# Patient Record
Sex: Female | Born: 1985 | Race: White | Hispanic: No | Marital: Single | Smoking: Current every day smoker
Health system: Southern US, Community
[De-identification: ages and names within clinical notes are randomized; demographics above are authoritative.]

## PROBLEM LIST (undated history)

## (undated) DIAGNOSIS — J45909 Unspecified asthma, uncomplicated: Secondary | ICD-10-CM

## (undated) DIAGNOSIS — I739 Peripheral vascular disease, unspecified: Secondary | ICD-10-CM

## (undated) DIAGNOSIS — I059 Rheumatic mitral valve disease, unspecified: Secondary | ICD-10-CM

## (undated) HISTORY — PX: ADENOIDECTOMY: SUR15

## (undated) HISTORY — DX: Peripheral vascular disease, unspecified: I73.9

## (undated) HISTORY — DX: Unspecified asthma, uncomplicated: J45.909

## (undated) HISTORY — PX: TONSILLECTOMY: SUR1361

## (undated) HISTORY — PX: VASCULAR SURGERY: SHX849

---

## 2005-03-19 ENCOUNTER — Emergency Department: Payer: Self-pay | Admitting: Emergency Medicine

## 2006-03-25 ENCOUNTER — Emergency Department: Payer: Self-pay | Admitting: General Practice

## 2006-03-28 ENCOUNTER — Emergency Department: Payer: Self-pay | Admitting: Emergency Medicine

## 2006-06-23 ENCOUNTER — Emergency Department: Payer: Self-pay

## 2006-06-25 ENCOUNTER — Emergency Department: Payer: Self-pay | Admitting: Emergency Medicine

## 2006-12-04 ENCOUNTER — Emergency Department: Payer: Self-pay | Admitting: Unknown Physician Specialty

## 2007-10-27 ENCOUNTER — Emergency Department: Payer: Self-pay | Admitting: Emergency Medicine

## 2008-02-14 ENCOUNTER — Emergency Department: Payer: Self-pay | Admitting: Emergency Medicine

## 2010-03-03 ENCOUNTER — Emergency Department: Payer: Self-pay | Admitting: Emergency Medicine

## 2010-04-25 ENCOUNTER — Emergency Department: Payer: Self-pay | Admitting: Emergency Medicine

## 2011-05-27 ENCOUNTER — Ambulatory Visit: Payer: Self-pay

## 2013-04-01 ENCOUNTER — Ambulatory Visit: Payer: Self-pay | Admitting: Physician Assistant

## 2013-05-09 ENCOUNTER — Emergency Department: Payer: Self-pay | Admitting: Emergency Medicine

## 2013-05-09 ENCOUNTER — Ambulatory Visit: Payer: Self-pay

## 2013-08-02 ENCOUNTER — Ambulatory Visit: Payer: Self-pay

## 2015-12-15 ENCOUNTER — Other Ambulatory Visit: Payer: Self-pay | Admitting: Orthopedic Surgery

## 2015-12-15 DIAGNOSIS — M24411 Recurrent dislocation, right shoulder: Secondary | ICD-10-CM

## 2016-01-04 ENCOUNTER — Ambulatory Visit
Admission: RE | Admit: 2016-01-04 | Discharge: 2016-01-04 | Disposition: A | Discharge status: Home / Self Care | Payer: Medicaid Other | Source: Ambulatory Visit | Attending: Orthopedic Surgery | Admitting: Orthopedic Surgery

## 2016-01-04 DIAGNOSIS — G5601 Carpal tunnel syndrome, right upper limb: Secondary | ICD-10-CM | POA: Diagnosis present

## 2016-01-04 DIAGNOSIS — M7581 Other shoulder lesions, right shoulder: Secondary | ICD-10-CM | POA: Insufficient documentation

## 2016-01-04 DIAGNOSIS — M24411 Recurrent dislocation, right shoulder: Secondary | ICD-10-CM | POA: Diagnosis not present

## 2016-08-03 ENCOUNTER — Encounter (INDEPENDENT_AMBULATORY_CARE_PROVIDER_SITE_OTHER): Payer: Self-pay | Admitting: Vascular Surgery

## 2016-08-03 ENCOUNTER — Other Ambulatory Visit (INDEPENDENT_AMBULATORY_CARE_PROVIDER_SITE_OTHER): Payer: Self-pay | Admitting: Vascular Surgery

## 2016-08-03 ENCOUNTER — Ambulatory Visit (INDEPENDENT_AMBULATORY_CARE_PROVIDER_SITE_OTHER): Payer: Medicaid Other | Admitting: Vascular Surgery

## 2016-08-03 ENCOUNTER — Ambulatory Visit (INDEPENDENT_AMBULATORY_CARE_PROVIDER_SITE_OTHER): Payer: Medicaid Other

## 2016-08-03 VITALS — BP 124/75 | HR 74 | Resp 16 | Ht 68.0 in | Wt 230.0 lb

## 2016-08-03 DIAGNOSIS — I83812 Varicose veins of left lower extremities with pain: Secondary | ICD-10-CM

## 2016-08-03 DIAGNOSIS — I872 Venous insufficiency (chronic) (peripheral): Secondary | ICD-10-CM | POA: Insufficient documentation

## 2016-08-03 DIAGNOSIS — I8311 Varicose veins of right lower extremity with inflammation: Secondary | ICD-10-CM

## 2016-08-03 DIAGNOSIS — I8312 Varicose veins of left lower extremity with inflammation: Principal | ICD-10-CM

## 2016-08-03 DIAGNOSIS — M7989 Other specified soft tissue disorders: Secondary | ICD-10-CM

## 2016-08-03 NOTE — Progress Notes (Signed)
Subjective:    Patient ID: Casey Hampton, female    DOB: 1986-02-19, 30 y.o.   MRN: [id number] Chief Complaint  Patient presents with  . Re-evaluation    Ultrasound follow up   Patient presents to review vascular studies. She is s/p a left lower extremity GSV ablation on 11/20/2015 followed by three sclerotherapy treatments. She has continued to wear medical grade one compression stockings and elevate her legs on a daily basis as recommended. Patient endorses a history of "squatting down" and feeling / hearing a "pop" in her left leg. The next day she noticed swelling and was experiencing pain. Compression and elevation has not helped. Her symptoms have worsened to the point they are effecting her ability to function on a daily basis. 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 No DVT or SVT.    Review of Systems  Constitutional: Negative.   HENT: Negative.   Eyes: Negative.   Respiratory: Negative.   Cardiovascular: Positive for leg swelling (Left Lower Extremity).       Left Lower Extremity Pain  Gastrointestinal: Negative.   Endocrine: Negative.   Genitourinary: Negative.   Musculoskeletal: Negative.   Skin: Negative.   Allergic/Immunologic: Negative.   Neurological: Negative.   Hematological: Negative.   Psychiatric/Behavioral: Negative.        Objective:   Physical Exam  Constitutional: She is oriented to person, place, and time. She appears well-developed and well-nourished.  HENT:  Head: Normocephalic and atraumatic.  Eyes: Conjunctivae and EOM are normal. Pupils are equal, round, and reactive to light.  Neck: Normal range of motion.  Cardiovascular: Normal rate, regular rhythm, normal heart sounds and intact distal pulses.   Pulses:      Radial pulses are 2+ on the right side, and 2+ on the left side.       Dorsalis pedis pulses are 2+ on the right side, and 2+ on the left side.      Posterior tibial pulses are 2+ on the right side, and 2+ on the left side.  Pulmonary/Chest: Effort normal and breath sounds normal.  Abdominal: Soft. Bowel sounds are normal.  Musculoskeletal: Normal range of motion. She exhibits edema (Moderate Left Lower Extremity).  Neurological: She is alert and oriented to person, place, and time.  Skin: Skin is warm and dry.  Venous Evaluation- Left- Diffuse varicosities present (largest area on anterior and lateral calf area.) and Varicosities measure 6-12mm.  Psychiatric: She has a normal mood and affect. Her behavior is normal. Judgment and thought content normal.   BP 124/75 (BP Location: Right Arm)   Pulse 74   Resp 16   Ht 5\' 8"  (1.727 m)   Wt 230 lb (104.3 kg)   BMI 34.97 kg/m   Past Medical History:  Diagnosis Date  . Asthma   . Peripheral vascular disease [facility])     Social History   Social History  . Marital status: Single    Spouse name: N/A  . Number of children: N/A  . Years of education: N/A   Occupational History  . Not on file.   Social History Main Topics  . Smoking status: Current Every Day Smoker  . Smokeless tobacco: Never Used  . Alcohol use Yes  . Drug use: No  . Sexual activity: Not on file   Other Topics Concern  . Not on file   Social History Narrative  . No narrative on file  Past Surgical History:  Procedure Laterality Date  . TONSILLECTOMY      Family History  Problem Relation Age of Onset  . Varicose Veins Mother   . Hyperlipidemia Mother   . Hypertension Mother     Allergies  Allergen Reactions  . Sulfa Antibiotics Anaphylaxis      Assessment & Plan:  Patient presents to review vascular studies. She is s/p a left lower extremity GSV ablation on 11/20/2015 followed by three sclerotherapy treatments. She has continued to wear medical grade one compression stockings and elevate her legs on a daily basis as recommended. Patient endorses a history of "squatting down" and feeling /  hearing a "pop" in her left leg. The next day she noticed swelling and was experiencing pain. Compression and elevation has not helped. Her symptoms have worsened to the point they are effecting her ability to function on a daily basis. 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 No DVT or SVT.   1. Chronic venous insufficiency - Worsening Patient with recannulated LLE GSV. Now symptomatic to the point it is interfering with her ability to function on a daily basis. Recommend laser ablation at a higher frequency to re-ablate her GSV. Would benefit from foam sclerotherapy into accessory vein with reflux/.   2. Swelling of left lower extremity - Worsening Applying for laser ablation of GSV. Continue compression and elevation.   3. Varicose veins of left lower extremity with inflammation - Worsening Applying for laser ablation of GSV. Continue compression and elevation.   No current outpatient prescriptions on file prior to visit.   No current facility-administered medications on file prior to visit.     There are no Patient Instructions on file for this visit. No Follow-up on file.   Travonte Byard A Marieclaire Bettenhausen, PA-C

## 2016-08-31 ENCOUNTER — Other Ambulatory Visit: Payer: Self-pay | Admitting: Nurse Practitioner

## 2016-08-31 DIAGNOSIS — R1031 Right lower quadrant pain: Secondary | ICD-10-CM

## 2016-09-06 ENCOUNTER — Ambulatory Visit
Admission: RE | Admit: 2016-09-06 | Discharge: 2016-09-06 | Disposition: A | Discharge status: Home / Self Care | Payer: Medicaid Other | Source: Ambulatory Visit | Attending: Nurse Practitioner | Admitting: Nurse Practitioner

## 2016-09-06 DIAGNOSIS — R1031 Right lower quadrant pain: Secondary | ICD-10-CM | POA: Diagnosis not present

## 2017-08-01 ENCOUNTER — Encounter: Payer: Self-pay | Admitting: Emergency Medicine

## 2017-08-01 DIAGNOSIS — J45909 Unspecified asthma, uncomplicated: Secondary | ICD-10-CM | POA: Insufficient documentation

## 2017-08-01 DIAGNOSIS — Z203 Contact with and (suspected) exposure to rabies: Secondary | ICD-10-CM | POA: Diagnosis present

## 2017-08-01 DIAGNOSIS — Z23 Encounter for immunization: Secondary | ICD-10-CM | POA: Diagnosis not present

## 2017-08-01 DIAGNOSIS — Z2914 Encounter for prophylactic rabies immune globin: Secondary | ICD-10-CM | POA: Insufficient documentation

## 2017-08-01 DIAGNOSIS — F17200 Nicotine dependence, unspecified, uncomplicated: Secondary | ICD-10-CM | POA: Diagnosis not present

## 2017-08-01 NOTE — ED Triage Notes (Signed)
Patient ambulatory to triage with steady gait, without difficulty or distress noted; pt reports was handling a bat Friday and Saturday; st was called by the health department and notified bat was tested positive for rabies

## 2017-08-02 ENCOUNTER — Emergency Department
Admission: EM | Admit: 2017-08-02 | Discharge: 2017-08-02 | Disposition: A | Discharge status: Home / Self Care | Payer: Medicaid Other | Attending: Emergency Medicine | Admitting: Emergency Medicine

## 2017-08-02 DIAGNOSIS — Z203 Contact with and (suspected) exposure to rabies: Secondary | ICD-10-CM

## 2017-08-02 MED ORDER — RABIES IMMUNE GLOBULIN 150 UNIT/ML IM INJ
20.0000 [IU]/kg | INJECTION | Freq: Once | INTRAMUSCULAR | Status: AC
  Administered 2017-08-02: 2250 [IU] via INTRAMUSCULAR
  Filled 2017-08-02: qty 15

## 2017-08-02 MED ORDER — RABIES VACCINE, PCEC IM SUSR
1.0000 mL | Freq: Once | INTRAMUSCULAR | Status: AC
  Administered 2017-08-02: 1 mL via INTRAMUSCULAR
  Filled 2017-08-02: qty 1

## 2017-08-02 NOTE — ED Notes (Signed)
Pt states dog got bat, which pt then handled with gloves, but bat was biting gloves. Animal Control informed pt of positive rabies in bat and to get vaccinated. Pt denies any sx abnormal, reports WNL

## 2017-08-02 NOTE — ED Notes (Signed)
No answer when called from lobby 

## 2017-08-02 NOTE — ED Notes (Signed)
Awaiting meds from pharmacy  

## 2017-08-02 NOTE — Discharge Instructions (Signed)
Please follow up with your primary care physician for further evaluation of your symptoms.  °

## 2017-08-02 NOTE — ED Provider Notes (Signed)
[facility] Emergency Department Provider Note   ____________________________________________   First MD Initiated Contact with Patient 08/02/17 [phone]     (approximate)  I have reviewed the triage vital signs and the nursing notes.   HISTORY  Chief Complaint Rabies Injection    HPI Casey Hampton is a 31 y.o. female Who comes into the hospital today with rabies exposure. The patient states that on Friday her dog found a live bat. The patient reports that she handled it for approximately 2 days. She reports that she was giving waterand holding it. She wore gloves but reports that the bat had been biting gloves and the towel. The patient reports that she had it for 2 days before sending it to animal control. The bat was tested in today it was  confirmed that the bat was positive for rabies. She is unsure if it actually bit into her skin but she was exposed to saliva. The patient was told to come in by the health department for rabies vaccine. She is here today for that treatment. He reports that her hands where she could've been bitten are raw and she does pick at them often. She doesn't have any specific bite marks. She is in no pain.   Past Medical History:  Diagnosis Date  . Asthma   . Peripheral vascular disease [facility](HCC)     Patient Active Problem List   Diagnosis Date Noted  . Swelling of left lower extremity 08/03/2016  . Varicose veins of left lower extremity with inflammation 08/03/2016  . Chronic venous insufficiency 08/03/2016    Past Surgical History:  Procedure Laterality Date  . ADENOIDECTOMY    . TONSILLECTOMY      Prior to Admission medications   Not on File    Allergies Sulfa antibiotics  Family History  Problem Relation Age of Onset  . Varicose Veins Mother   . Hyperlipidemia Mother   . Hypertension Mother     Social History Social History  Substance Use Topics  . Smoking status: Current Every Day Smoker  . Smokeless  tobacco: Never Used  . Alcohol use Yes    Review of Systems  Constitutional: No fever/chills Eyes: No visual changes. ENT: No sore throat. Cardiovascular: Denies chest pain. Respiratory: Denies shortness of breath. Gastrointestinal: No abdominal pain.  No nausea, no vomiting.  No diarrhea.  No constipation. Genitourinary: Negative for dysuria. Musculoskeletal: Negative for back pain. Skin: Negative for rash. Neurological: Negative for headaches, focal weakness or numbness.   ____________________________________________   PHYSICAL EXAM:  VITAL SIGNS: ED Triage Vitals  Enc Vitals Group     BP 08/01/17 2342 128/79     Pulse Rate 08/01/17 2342 98     Resp 08/01/17 2342 18     Temp 08/01/17 2342 98 F (36.7 C)     Temp Source 08/01/17 2342 Oral     SpO2 08/01/17 2342 99 %     Weight 08/01/17 2340 244 lb (110.7 kg)     Height 08/01/17 2340 5\' 9"  (1.753 m)     Head Circumference --      Peak Flow --      Pain Score --      Pain Loc --      Pain Edu? --      Excl. in GC? --     Constitutional: Alert and oriented. Well appearing and in no acute distress. Eyes: Conjunctivae are normal. PERRL. EOMI. Head: Atraumatic. Nose: No congestion/rhinnorhea. Mouth/Throat: Mucous membranes are  moist.  Oropharynx non-erythematous. Cardiovascular: Normal rate, regular rhythm. Grossly normal heart sounds.  Good peripheral circulation. Respiratory: Normal respiratory effort.  No retractions. Lungs CTAB. Gastrointestinal: Soft and nontender. No distention. positive bowel sounds Musculoskeletal: No lower extremity tenderness nor edema.   Neurologic:  Normal speech and language.  Skin:  Skin is warm, dry and intact.  Psychiatric: Mood and affect are normal.   ____________________________________________   LABS (all labs ordered are listed, but only abnormal results are displayed)  Labs Reviewed  POC URINE PREG, ED    ____________________________________________  EKG  none ____________________________________________  RADIOLOGY  No results found.  ____________________________________________   PROCEDURES  Procedure(s) performed: None  Procedures  Critical Care performed: No  ____________________________________________   INITIAL IMPRESSION / ASSESSMENT AND PLAN / ED COURSE  As part of my medical decision making, I reviewed the following data within the electronic MEDICAL RECORD NUMBER Notes from prior ED visits and [place] Controlled Substance Database   This is a 31 year old female who comes into the hospital today after having rabies exposure. The patient was sent by the health department to receive a rabies vaccine.  Since the patient was exposed to a bat was positive for rabies I will give the patient the rabies vaccine as well as the immunoglobulin. The patient will be discharged to follow-up with her primary care physician. She has no further complaints or concerns.      ____________________________________________   FINAL CLINICAL IMPRESSION(S) / ED DIAGNOSES  Final diagnoses:  Rabies exposure      NEW MEDICATIONS STARTED DURING THIS VISIT:  New Prescriptions   No medications on file     Note:  This document was prepared using Dragon voice recognition software and may include unintentional dictation errors.    Rebecka Apley, MD 08/02/17 [phone]

## 2017-08-02 NOTE — ED Notes (Signed)

## 2017-08-06 ENCOUNTER — Encounter: Payer: Self-pay | Admitting: *Deleted

## 2017-08-06 ENCOUNTER — Ambulatory Visit
Admission: EM | Admit: 2017-08-06 | Discharge: 2017-08-06 | Disposition: A | Discharge status: Home / Self Care | Payer: Medicaid Other

## 2017-08-06 DIAGNOSIS — Z203 Contact with and (suspected) exposure to rabies: Secondary | ICD-10-CM

## 2017-08-06 DIAGNOSIS — Z23 Encounter for immunization: Secondary | ICD-10-CM

## 2017-08-06 MED ORDER — RABIES VACCINE, PCEC IM SUSR
1.0000 mL | Freq: Once | INTRAMUSCULAR | Status: AC
  Administered 2017-08-06: 1 mL via INTRAMUSCULAR

## 2017-08-06 NOTE — ED Triage Notes (Signed)
Patient arrived for rabies vaccination.

## 2017-08-13 ENCOUNTER — Ambulatory Visit
Admission: EM | Admit: 2017-08-13 | Discharge: 2017-08-13 | Disposition: A | Discharge status: Home / Self Care | Payer: Medicaid Other | Attending: Family Medicine | Admitting: Family Medicine

## 2017-08-13 DIAGNOSIS — Z203 Contact with and (suspected) exposure to rabies: Secondary | ICD-10-CM

## 2017-08-13 DIAGNOSIS — Z23 Encounter for immunization: Secondary | ICD-10-CM | POA: Diagnosis not present

## 2017-08-13 MED ORDER — RABIES VACCINE, PCEC IM SUSR
1.0000 mL | Freq: Once | INTRAMUSCULAR | Status: AC
  Administered 2017-08-13: 1 mL via INTRAMUSCULAR

## 2017-08-13 NOTE — ED Triage Notes (Signed)
Patient is here for rabies vaccine DAY 7. Patient will return in 1 week for Day 14 and last dose.

## 2017-08-13 NOTE — Discharge Instructions (Signed)
Return in 7 days for DAY 14 of Rabies Vaccine.

## 2017-08-21 ENCOUNTER — Ambulatory Visit
Admission: EM | Admit: 2017-08-21 | Discharge: 2017-08-21 | Disposition: A | Discharge status: Home / Self Care | Payer: Medicaid Other | Attending: Family Medicine | Admitting: Family Medicine

## 2017-08-21 DIAGNOSIS — Z203 Contact with and (suspected) exposure to rabies: Secondary | ICD-10-CM

## 2017-08-21 DIAGNOSIS — Z23 Encounter for immunization: Secondary | ICD-10-CM | POA: Diagnosis not present

## 2017-08-21 MED ORDER — RABIES VACCINE, PCEC IM SUSR
1.0000 mL | Freq: Once | INTRAMUSCULAR | Status: AC
  Administered 2017-08-21: 1 mL via INTRAMUSCULAR

## 2017-08-21 NOTE — ED Triage Notes (Signed)
Patient is here for Day 14 vaccine. Patient has had no adverse signs and symptoms to previous injections.

## 2017-08-21 NOTE — ED Notes (Signed)
Patient waited 15 minutes for observation. No adverse signs or symptoms noted at discharge.

## 2017-09-18 ENCOUNTER — Ambulatory Visit
Admission: EM | Admit: 2017-09-18 | Discharge: 2017-09-18 | Disposition: A | Discharge status: Home / Self Care | Payer: Medicaid Other | Attending: Family Medicine | Admitting: Family Medicine

## 2017-09-18 ENCOUNTER — Encounter: Payer: Self-pay | Admitting: Emergency Medicine

## 2017-09-18 ENCOUNTER — Other Ambulatory Visit: Payer: Self-pay

## 2017-09-18 DIAGNOSIS — F17200 Nicotine dependence, unspecified, uncomplicated: Secondary | ICD-10-CM | POA: Diagnosis not present

## 2017-09-18 DIAGNOSIS — J45909 Unspecified asthma, uncomplicated: Secondary | ICD-10-CM | POA: Diagnosis not present

## 2017-09-18 DIAGNOSIS — J020 Streptococcal pharyngitis: Secondary | ICD-10-CM | POA: Diagnosis not present

## 2017-09-18 DIAGNOSIS — I872 Venous insufficiency (chronic) (peripheral): Secondary | ICD-10-CM | POA: Diagnosis not present

## 2017-09-18 DIAGNOSIS — R05 Cough: Secondary | ICD-10-CM | POA: Insufficient documentation

## 2017-09-18 DIAGNOSIS — I739 Peripheral vascular disease, unspecified: Secondary | ICD-10-CM | POA: Insufficient documentation

## 2017-09-18 DIAGNOSIS — Z79899 Other long term (current) drug therapy: Secondary | ICD-10-CM | POA: Diagnosis not present

## 2017-09-18 DIAGNOSIS — J029 Acute pharyngitis, unspecified: Secondary | ICD-10-CM | POA: Diagnosis present

## 2017-09-18 HISTORY — DX: Rheumatic mitral valve disease, unspecified: I05.9

## 2017-09-18 LAB — RAPID STREP SCREEN (MED CTR MEBANE ONLY): STREPTOCOCCUS, GROUP A SCREEN (DIRECT): POSITIVE — AB

## 2017-09-18 MED ORDER — FLUCONAZOLE 150 MG PO TABS: 150.0000 mg | ORAL_TABLET | Freq: Once | ORAL | 0 refills | Status: AC

## 2017-09-18 MED ORDER — AZITHROMYCIN 250 MG PO TABS
250.0000 mg | ORAL_TABLET | Freq: Every day | ORAL | 0 refills | Status: DC
Start: 2017-09-18 — End: 2017-12-04

## 2017-09-18 NOTE — ED Triage Notes (Signed)
Patient in today c/o sore throat x 1 day. Patient started today with cough, sneezing and nasal congestion. Patient has not tried anything OTC. Patient has not checked her temperature.

## 2017-09-18 NOTE — Discharge Instructions (Signed)
Recommend start Zithromax daily as directed. May continue Amoxicillin as directed from your dentist. May take Tylenol 1000mg  every 8 hours as needed for pain. If yeast infection develops from antibiotic use, may use Diflucan 150mg  one time, may repeat in 5 days if needed. Recommend follow-up with your PCP in 3 days if not improving.

## 2017-09-18 NOTE — ED Provider Notes (Signed)
[facility]    CSN: [id number] Arrival date & time: 09/18/17  1811     History   Chief Complaint Chief Complaint  Patient presents with  . Sore Throat    HPI Casey Hampton is a 31 y.o. female.   31 year old female presents with sore throat that started yesterday. Today started experiencing mild nasal congestion and cough. Feels warm but no distinct fever. Denies any GI symptoms. No other family members ill. Did receive the flu shot. Has history of peripheral swelling and pain as well as Asthma and is on HCTZ daily and Ibuprofen and Albuterol as needed. Also having multiple dental issues- needs multiple repairs- dentist has prescribed Amoxicillin in the past- started having more dental pain 5 days ago and started taking her left over Amoxicillin 875mg  just once a day for the past 4 days. Has not taken any other OTC medications for symptoms.    The history is provided by the patient.    Past Medical History:  Diagnosis Date  . Asthma   . Mitral valve disorder   . Peripheral vascular disease [facility](HCC)     Patient Active Problem List   Diagnosis Date Noted  . Swelling of left lower extremity 08/03/2016  . Varicose veins of left lower extremity with inflammation 08/03/2016  . Chronic venous insufficiency 08/03/2016    Past Surgical History:  Procedure Laterality Date  . ADENOIDECTOMY    . TONSILLECTOMY    . VASCULAR SURGERY      OB History    No data available       Home Medications    Prior to Admission medications   Medication Sig Start Date End Date Taking? Authorizing Provider  albuterol (PROVENTIL HFA;VENTOLIN HFA) 108 (90 Base) MCG/ACT inhaler Inhale 1 puff into the lungs every 6 (six) hours as needed for wheezing or shortness of breath.   Yes [provider]  hydrochlorothiazide (HYDRODIURIL) 25 MG tablet Take 25 mg by mouth daily.   Yes [provider]  ibuprofen (ADVIL,MOTRIN) 800 MG tablet Take 800 mg by mouth 2 (two) times  daily.   Yes [provider]  loratadine (CLARITIN) 10 MG tablet Take 10 mg by mouth daily.   Yes [provider]  phentermine 37.5 MG capsule Take 37.5 mg by mouth every morning.   Yes [provider]  traMADol (ULTRAM) 50 MG tablet Take by mouth every 6 (six) hours as needed.   Yes [provider]  azithromycin (ZITHROMAX) 250 MG tablet Take 1 tablet (250 mg total) by mouth daily. Take first 2 tablets together, then 1 every day until finished. 09/18/17   Sudie GrumblingAmyot, Dreyah Montrose Berry, NP  fluconazole (DIFLUCAN) 150 MG tablet Take 1 tablet (150 mg total) by mouth once for 1 dose. May repeat 1 tablet in 5 days if needed. 09/18/17 09/18/17  Sudie GrumblingAmyot, Broly Hatfield Berry, NP  olopatadine (PATANOL) 0.1 % ophthalmic solution Place 1 drop into both eyes 2 (two) times daily.    [provider]    Family History Family History  Problem Relation Age of Onset  . Varicose Veins Mother   . Hyperlipidemia Mother   . Hypertension Mother     Social History Social History   Tobacco Use  . Smoking status: Current Every Day Smoker  . Smokeless tobacco: Never Used  Substance Use Topics  . Alcohol use: Yes    Comment: rarely  . Drug use: No     Allergies   Sulfa antibiotics   Review  of Systems Review of Systems  Constitutional: Positive for chills and fatigue. Negative for activity change, appetite change and fever.  HENT: Positive for congestion, postnasal drip, sore throat and trouble swallowing. Negative for ear discharge, ear pain, facial swelling, mouth sores, rhinorrhea, sinus pressure and sinus pain.   Eyes: Negative for pain, discharge, redness and itching.  Respiratory: Positive for cough. Negative for chest tightness, shortness of breath and wheezing.   Gastrointestinal: Negative for abdominal pain, diarrhea, nausea and vomiting.  Musculoskeletal: Positive for arthralgias. Negative for neck pain and neck stiffness.  Skin: Negative for rash and wound.  Neurological:  Negative for dizziness, seizures, syncope, weakness, light-headedness, numbness and headaches.  Hematological: Negative for adenopathy. Does not bruise/bleed easily.     Physical Exam Triage Vital Signs ED Triage Vitals  Enc Vitals Group     BP 09/18/17 1821 115/75     Pulse Rate 09/18/17 1821 (!) 107     Resp 09/18/17 1821 16     Temp 09/18/17 1821 98.2 F (36.8 C)     Temp Source 09/18/17 1821 Oral     SpO2 09/18/17 1821 100 %     Weight 09/18/17 1821 245 lb (111.1 kg)     Height 09/18/17 1821 5\' 8"  (1.727 m)     Head Circumference --      Peak Flow --      Pain Score 09/18/17 1822 7     Pain Loc --      Pain Edu? --      Excl. in GC? --    No data found.  Updated Vital Signs BP 115/75 (BP Location: Left Arm)   Pulse (!) 107   Temp 98.2 F (36.8 C) (Oral)   Resp 16   Ht 5\' 8"  (1.727 m)   Wt 245 lb (111.1 kg)   LMP 08/26/2017   SpO2 100%   BMI 37.25 kg/m   Visual Acuity Right Eye Distance:   Left Eye Distance:   Bilateral Distance:    Right Eye Near:   Left Eye Near:    Bilateral Near:     Physical Exam  Constitutional: She is oriented to person, place, and time. She appears well-developed and well-nourished. She appears ill. No distress.  HENT:  Head: Normocephalic and atraumatic.  Right Ear: Hearing, tympanic membrane, external ear and ear canal normal.  Left Ear: Hearing, tympanic membrane, external ear and ear canal normal.  Nose: Rhinorrhea present. Right sinus exhibits no maxillary sinus tenderness and no frontal sinus tenderness. Left sinus exhibits no maxillary sinus tenderness and no frontal sinus tenderness.  Mouth/Throat: Uvula is midline and mucous membranes are normal. Oropharyngeal exudate, posterior oropharyngeal edema and posterior oropharyngeal erythema present.  Tonsils absent  Eyes: Conjunctivae and EOM are normal.  Neck: Normal range of motion. Neck supple.  Cardiovascular: Regular rhythm. Tachycardia present.  No murmur  heard. Pulmonary/Chest: Effort normal and breath sounds normal. No stridor. No respiratory distress. She has no decreased breath sounds. She has no wheezes. She has no rhonchi.  Lymphadenopathy:    She has cervical adenopathy.       Right cervical: Superficial cervical adenopathy present.       Left cervical: Superficial cervical adenopathy present.  Neurological: She is alert and oriented to person, place, and time.  Skin: Skin is warm and dry. No rash noted.  Psychiatric: She has a normal mood and affect. Her behavior is normal. Judgment and thought content normal.     UC Treatments / Results  Labs (all labs ordered are listed, but only abnormal results are displayed) Labs Reviewed  RAPID STREP SCREEN (NOT AT [facility]) - Abnormal; Notable for the following components:      Result Value   Streptococcus, Group A Screen (Direct) POSITIVE (*)    All other components within normal limits    EKG  EKG Interpretation None       Radiology No results found.  Procedures Procedures (including critical care time)  Medications Ordered in UC Medications - No data to display   Initial Impression / Assessment and Plan / UC Course  I have reviewed the triage vital signs and the nursing notes.  Pertinent labs & imaging results that were available during my care of the patient were reviewed by me and considered in my medical decision making (see chart for details).    Reviewed positive rapid strep test with patient. Since she is currently on Amoxicillin and still having symptoms, recommend start Zithromax as directed. Continue Amoxicillin for dental issues as directed. May take Diflucan 150mg  one time if yeast vaginitis develops from antibiotic use- may repeat in 5 days if needed. Recommend Tylenol 1000mg  every 8 hours as needed for pain. Follow-up with her PCP in 3 days if not improving.   Final Clinical Impressions(s) / UC Diagnoses   Final diagnoses:  Strep pharyngitis    ED  Discharge Orders        Ordered    azithromycin (ZITHROMAX) 250 MG tablet  Daily     09/18/17 1854    fluconazole (DIFLUCAN) 150 MG tablet   Once     09/18/17 1854       Controlled Substance Prescriptions [place] Controlled Substance Registry consulted? Not Applicable   Sudie Grumbling, NP 09/18/17 2328

## 2017-12-04 ENCOUNTER — Ambulatory Visit
Admission: EM | Admit: 2017-12-04 | Discharge: 2017-12-04 | Disposition: A | Discharge status: Home / Self Care | Payer: Medicaid Other | Attending: Family Medicine | Admitting: Family Medicine

## 2017-12-04 DIAGNOSIS — K047 Periapical abscess without sinus: Secondary | ICD-10-CM

## 2017-12-04 MED ORDER — AMOXICILLIN-POT CLAVULANATE 875-125 MG PO TABS: 1.0000 | ORAL_TABLET | Freq: Two times a day (BID) | ORAL | 0 refills | Status: DC

## 2017-12-04 MED ORDER — TRAMADOL HCL 50 MG PO TABS: 50.0000 mg | ORAL_TABLET | Freq: Three times a day (TID) | ORAL | 0 refills | Status: DC | PRN

## 2017-12-04 NOTE — Discharge Instructions (Signed)
Antibiotic as prescribed.  Please see your dentist.  Take care  Dr. Tailey Top  

## 2017-12-04 NOTE — ED Triage Notes (Signed)
Pt said she needs a root canal on her left side of her mouth. Now having facial swelling up under her eye, along with some nausea and diarrhea. Still having pain, headache, sensitivity to light and cold chills as well. This has been going on since Friday. Did not take any otc medications.

## 2017-12-04 NOTE — ED Provider Notes (Signed)
[facility]  CSN: [id number] Arrival date & time: 12/04/17  1926  History   Chief Complaint Chief Complaint  Patient presents with  . Oral Swelling   HPI  32 year old female presents with a dental infection.  States that this is been going on for quite some time.  She has seen a dentist and was told that she needed an extensive amount of work done.  She states that since Friday it has been worsening.  Pain is worse and she reports associated left-sided facial swelling.  No fever.  No chills.  No improvement with over-the-counter ibuprofen.  Her pain is located in the left upper dentition.  No other associated symptoms.  No other complaints at this time.  Past Medical History:  Diagnosis Date  . Asthma   . Mitral valve disorder   . Peripheral vascular disease [facility](HCC)    Patient Active Problem List   Diagnosis Date Noted  . Swelling of left lower extremity 08/03/2016  . Varicose veins of left lower extremity with inflammation 08/03/2016  . Chronic venous insufficiency 08/03/2016   Past Surgical History:  Procedure Laterality Date  . ADENOIDECTOMY    . TONSILLECTOMY    . VASCULAR SURGERY     OB History    No data available     Home Medications    Prior to Admission medications   Medication Sig Start Date End Date Taking? Authorizing Provider  hydrochlorothiazide (HYDRODIURIL) 25 MG tablet Take 25 mg by mouth daily.   Yes [provider]  phentermine 37.5 MG capsule Take 37.5 mg by mouth every morning.   Yes [provider]  albuterol (PROVENTIL HFA;VENTOLIN HFA) 108 (90 Base) MCG/ACT inhaler Inhale 1 puff into the lungs every 6 (six) hours as needed for wheezing or shortness of breath.    [provider]  amoxicillin-clavulanate (AUGMENTIN) 875-125 MG tablet Take 1 tablet by mouth every 12 (twelve) hours. 12/04/17   Tommie Samsook, Ernestina Joe G, DO  loratadine (CLARITIN) 10 MG tablet Take 10 mg by mouth daily.    [provider]  traMADol  (ULTRAM) 50 MG tablet Take 1 tablet (50 mg total) by mouth every 8 (eight) hours as needed. 12/04/17   Tommie Samsook, Jarrid Lienhard G, DO   Family History Family History  Problem Relation Age of Onset  . Varicose Veins Mother   . Hyperlipidemia Mother   . Hypertension Mother    Social History Social History   Tobacco Use  . Smoking status: Current Every Day Smoker  . Smokeless tobacco: Never Used  Substance Use Topics  . Alcohol use: Yes    Comment: rarely  . Drug use: No    Allergies   Sulfa antibiotics  Review of Systems Review of Systems  Constitutional: Negative for fever.  HENT: Positive for dental problem.        Facial swelling.   Physical Exam Triage Vital Signs ED Triage Vitals  Enc Vitals Group     BP 12/04/17 1936 119/74     Pulse Rate 12/04/17 1936 94     Resp 12/04/17 1936 18     Temp 12/04/17 1936 98.2 F (36.8 C)     Temp Source 12/04/17 1936 Oral     SpO2 12/04/17 1936 100 %     Weight 12/04/17 1938 230 lb (104.3 kg)     Height --      Head Circumference --      Peak Flow --      Pain Score 12/04/17 1938 8  Pain Loc --      Pain Edu? --      Excl. in GC? --    Updated Vital Signs BP 119/74 (BP Location: Left Arm)   Pulse 94   Temp 98.2 F (36.8 C) (Oral)   Resp 18   Wt 230 lb (104.3 kg)   LMP 11/27/2017 (Exact Date)   SpO2 100%   BMI 34.97 kg/m    Physical Exam  Constitutional: She is oriented to person, place, and time. She appears well-developed. No distress.  HENT:  Head: Normocephalic and atraumatic.  Oropharynx clear. Poor dentition. Caries noted.   Cardiovascular: Normal rate and regular rhythm.  No murmur heard. Pulmonary/Chest: Effort normal and breath sounds normal. She has no wheezes. She has no rales.  Neurological: She is alert and oriented to person, place, and time.  Psychiatric: She has a normal mood and affect. Her behavior is normal.  Nursing note and vitals reviewed.  UC Treatments / Results  Labs (all labs ordered are  listed, but only abnormal results are displayed) Labs Reviewed - No data to display  EKG  EKG Interpretation None       Radiology No results found.  Procedures Procedures (including critical care time)  Medications Ordered in UC Medications - No data to display   Initial Impression / Assessment and Plan / UC Course  I have reviewed the triage vital signs and the nursing notes.  Pertinent labs & imaging results that were available during my care of the patient were reviewed by me and considered in my medical decision making (see chart for details).     32 year old female presents with a dental infection. Treating with Augmentin and Tramadol.   Final Clinical Impressions(s) / UC Diagnoses   Final diagnoses:  Dental infection    ED Discharge Orders        Ordered    amoxicillin-clavulanate (AUGMENTIN) 875-125 MG tablet  Every 12 hours     12/04/17 1955    traMADol (ULTRAM) 50 MG tablet  Every 8 hours PRN     12/04/17 1955     Controlled Substance Prescriptions [place] Controlled Substance Registry consulted? Yes, I have consulted the  Controlled Substances Registry for this patient, and feel the risk/benefit ratio today is favorable for proceeding with this prescription for a controlled substance.   Tommie Sams, [place] 12/04/17 2008

## 2018-03-02 IMAGING — US US TRANSVAGINAL NON-OB
1 series · 14 of 25 positions shown · non-contrast
Comparison: None

CLINICAL DATA: Abdominal pain.

EXAM:
TRANSABDOMINAL AND TRANSVAGINAL ULTRASOUND OF PELVIS
TECHNIQUE: Both transabdominal and transvaginal ultrasound examinations of the
pelvis were performed. Transabdominal technique was performed for
global imaging of the pelvis including uterus, ovaries, adnexal
regions, and pelvic cul-de-sac. It was necessary to proceed with
endovaginal exam following the transabdominal exam to visualize the
uterus and ovaries.

[Series 1: us transvaginal non-ob · 0.24mm/px · 14 of 105 slices shown]
[im 1/105]
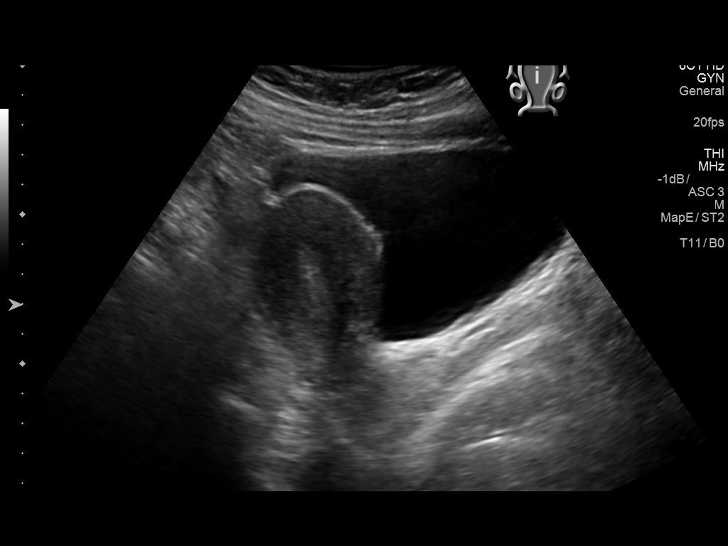
[im 9/105]
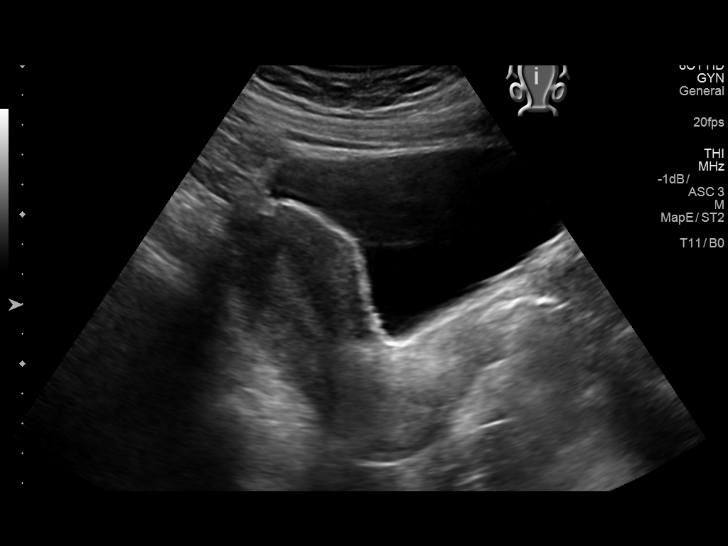
[im 18/105]
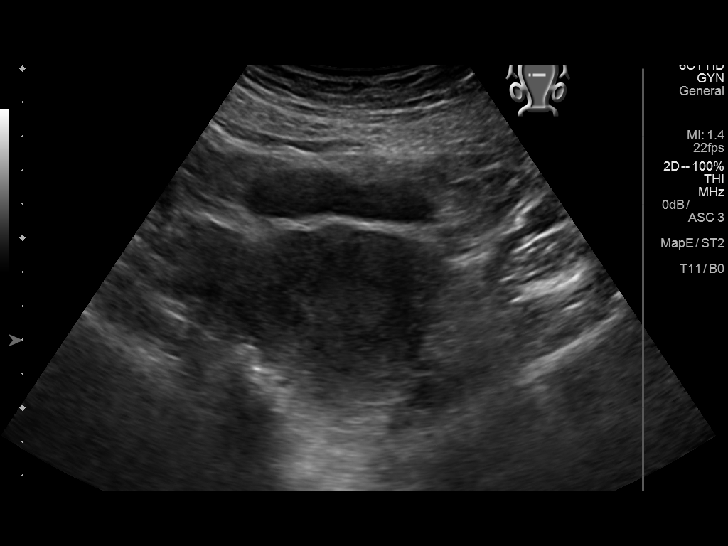
[im 27/105]
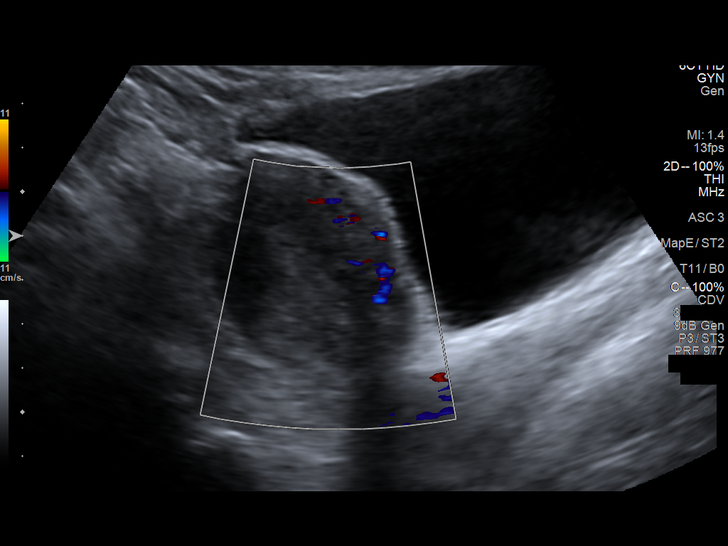
[im 35/105]
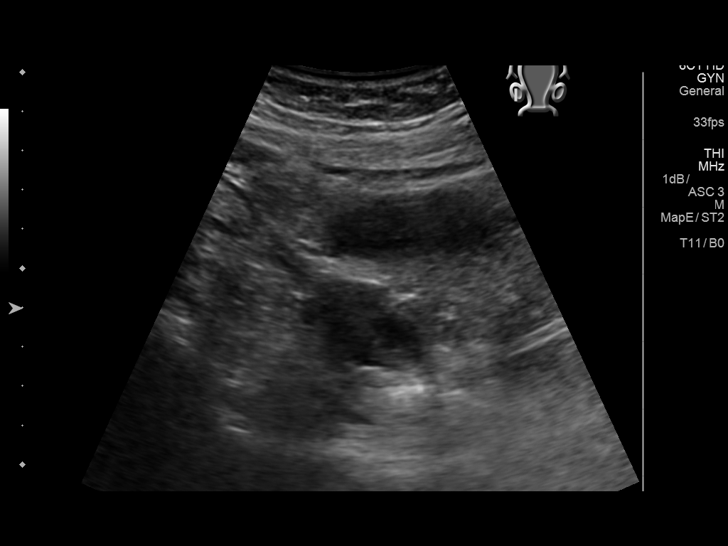
[im 40/105]
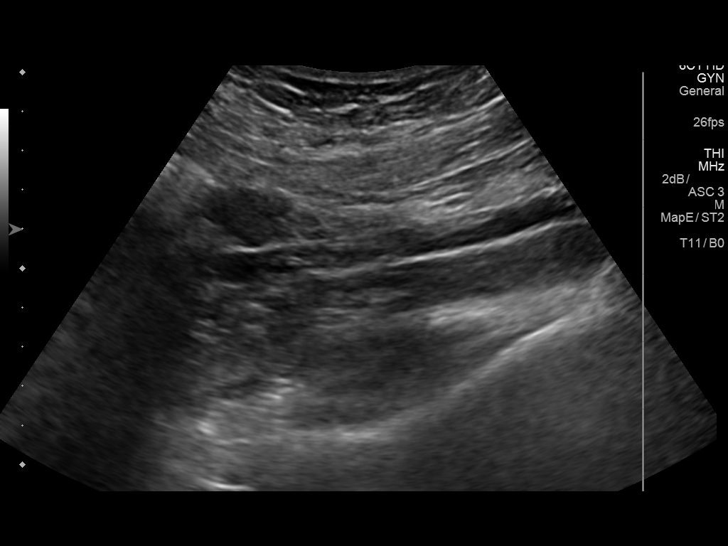
[im 48/105]
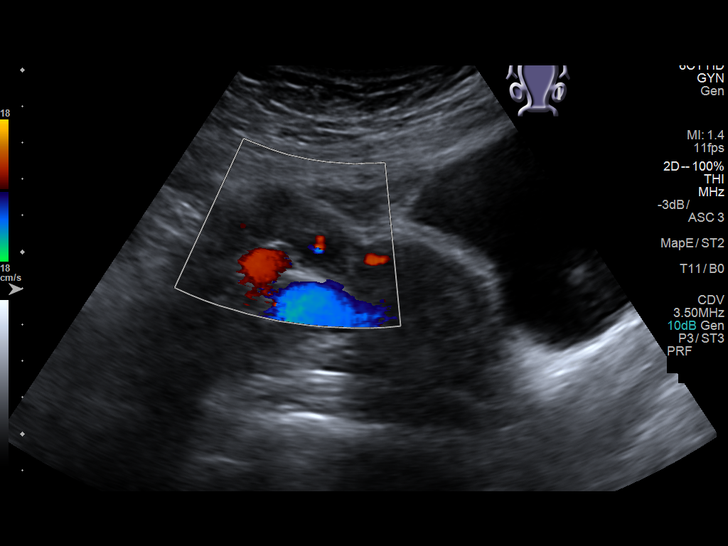
[im 57/105]
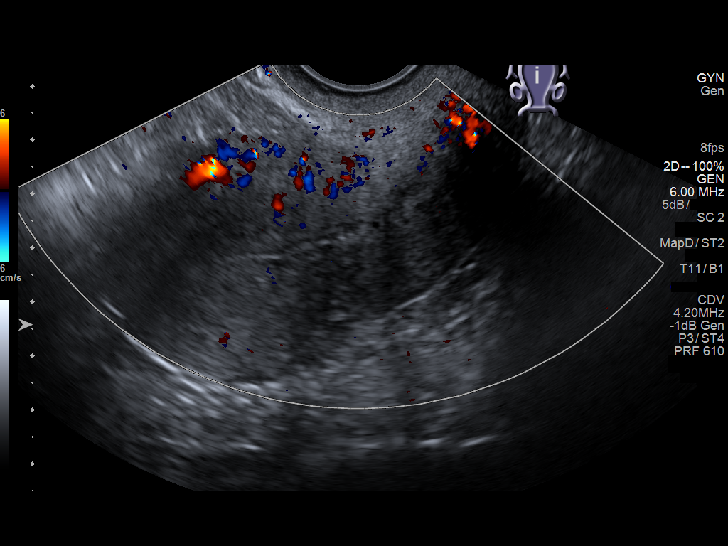
[im 66/105]
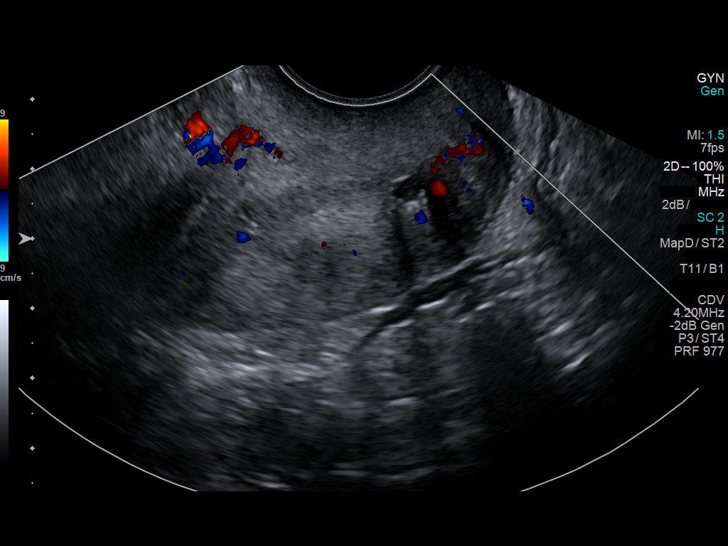
[im 70/105]
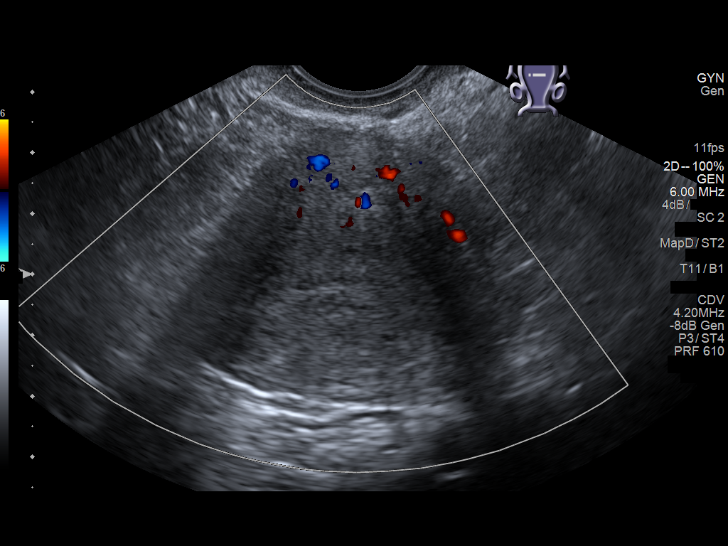
[im 79/105]
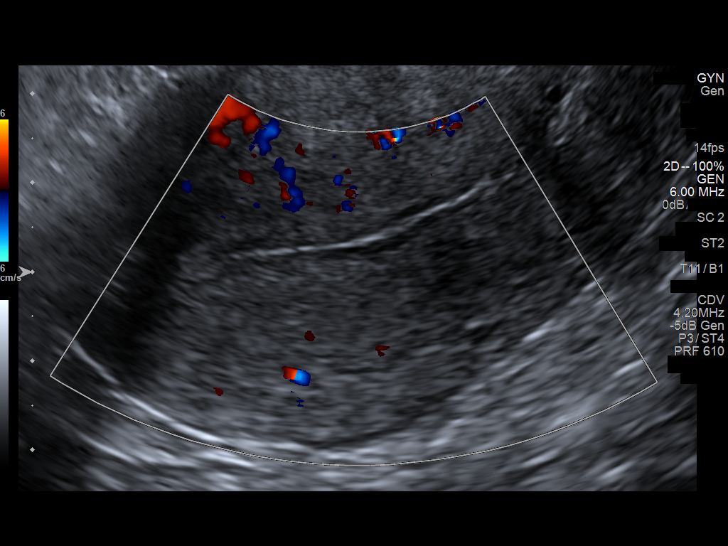
[im 87/105]
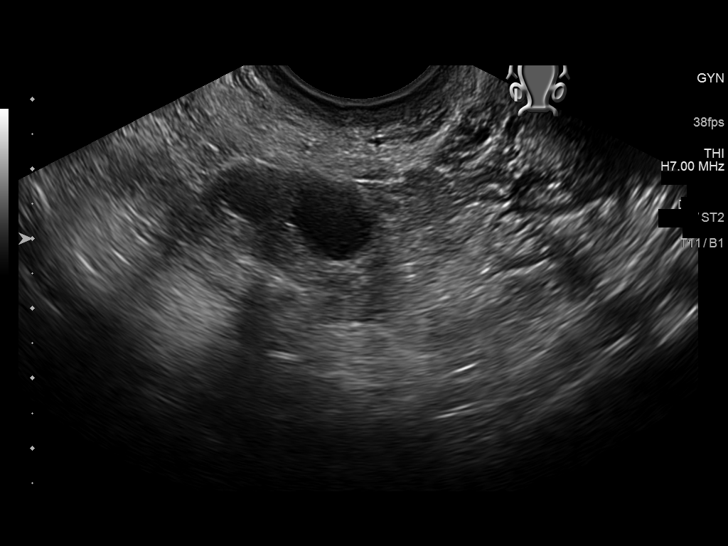
[im 96/105]
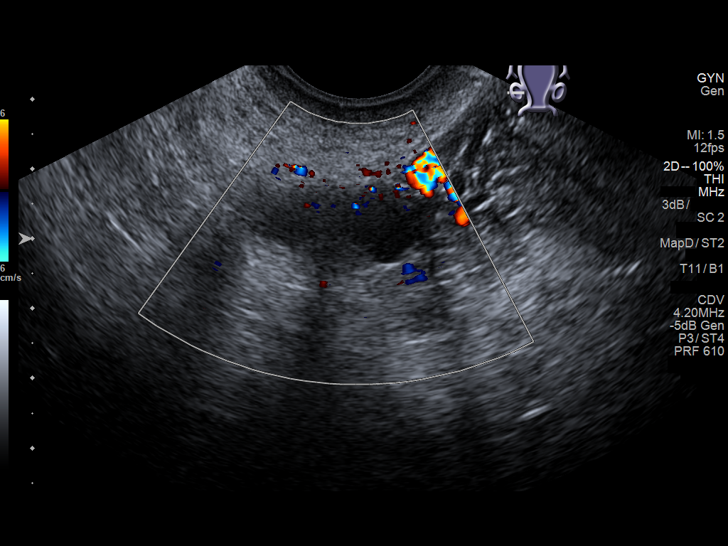
[im 105/105]
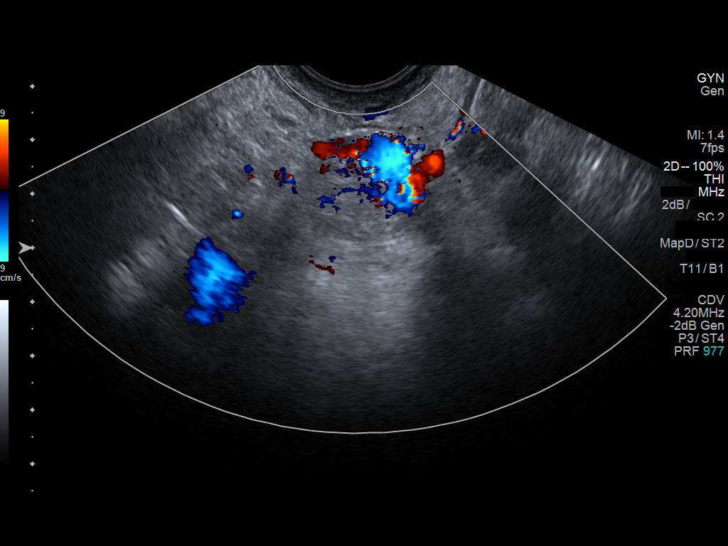

[14 of 25 positions shown; findings below may reference images not displayed]

FINDINGS: Uterus

Measurements: 9.2 x 4.5 x 6.0 cm. No fibroids or other mass
visualized.

Endometrium

Thickness: 9 mm.  No focal abnormality visualized.

Right ovary

Measurements: 3.0 x 2.1 x 2.0 cm. 1.3 cm dominant follicle.

Left ovary

Measurements: 3.7 x 1.6 x 2.3 cm. Normal appearance/no adnexal mass.

Other findings

No abnormal free fluid.
IMPRESSION: Normal exam.

## 2018-05-23 ENCOUNTER — Ambulatory Visit
Admission: EM | Admit: 2018-05-23 | Discharge: 2018-05-23 | Disposition: A | Discharge status: Home / Self Care | Payer: Medicaid Other | Attending: Family Medicine | Admitting: Family Medicine

## 2018-05-23 DIAGNOSIS — R58 Hemorrhage, not elsewhere classified: Secondary | ICD-10-CM | POA: Diagnosis not present

## 2018-05-23 DIAGNOSIS — S40022D Contusion of left upper arm, subsequent encounter: Secondary | ICD-10-CM

## 2018-05-23 MED ORDER — CYCLOBENZAPRINE HCL 10 MG PO TABS: 10.0000 mg | ORAL_TABLET | Freq: Every day | ORAL | 0 refills | Status: DC

## 2018-05-23 NOTE — Discharge Instructions (Addendum)
Follow up with Primary Care as needed Continue tylenol and ibuprofen as needed
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
## 2019-07-02 ENCOUNTER — Other Ambulatory Visit: Payer: Self-pay | Admitting: Internal Medicine

## 2019-07-02 DIAGNOSIS — Z20822 Contact with and (suspected) exposure to covid-19: Secondary | ICD-10-CM

## 2019-07-04 LAB — NOVEL CORONAVIRUS, NAA: SARS-CoV-2, NAA: NOT DETECTED

## 2019-07-16 ENCOUNTER — Emergency Department (HOSPITAL_COMMUNITY): Payer: Medicaid Other

## 2019-07-16 ENCOUNTER — Other Ambulatory Visit: Payer: Self-pay

## 2019-07-16 ENCOUNTER — Encounter (HOSPITAL_COMMUNITY): Payer: Self-pay

## 2019-07-16 ENCOUNTER — Emergency Department (HOSPITAL_COMMUNITY)
Admission: EM | Admit: 2019-07-16 | Discharge: 2019-07-16 | Disposition: A | Discharge status: Home / Self Care | Payer: Medicaid Other | Attending: Emergency Medicine | Admitting: Emergency Medicine

## 2019-07-16 DIAGNOSIS — F17200 Nicotine dependence, unspecified, uncomplicated: Secondary | ICD-10-CM | POA: Insufficient documentation

## 2019-07-16 DIAGNOSIS — Y9289 Other specified places as the place of occurrence of the external cause: Secondary | ICD-10-CM | POA: Insufficient documentation

## 2019-07-16 DIAGNOSIS — Z23 Encounter for immunization: Secondary | ICD-10-CM | POA: Insufficient documentation

## 2019-07-16 DIAGNOSIS — S91332A Puncture wound without foreign body, left foot, initial encounter: Secondary | ICD-10-CM | POA: Insufficient documentation

## 2019-07-16 DIAGNOSIS — Y939 Activity, unspecified: Secondary | ICD-10-CM | POA: Insufficient documentation

## 2019-07-16 DIAGNOSIS — T148XXA Other injury of unspecified body region, initial encounter: Secondary | ICD-10-CM

## 2019-07-16 DIAGNOSIS — Z79899 Other long term (current) drug therapy: Secondary | ICD-10-CM | POA: Insufficient documentation

## 2019-07-16 DIAGNOSIS — J45909 Unspecified asthma, uncomplicated: Secondary | ICD-10-CM | POA: Insufficient documentation

## 2019-07-16 DIAGNOSIS — Y990 Civilian activity done for income or pay: Secondary | ICD-10-CM | POA: Insufficient documentation

## 2019-07-16 DIAGNOSIS — W458XXA Other foreign body or object entering through skin, initial encounter: Secondary | ICD-10-CM | POA: Insufficient documentation

## 2019-07-16 MED ORDER — IBUPROFEN 800 MG PO TABS
800.0000 mg | ORAL_TABLET | Freq: Once | ORAL | Status: AC
  Administered 2019-07-16: 800 mg via ORAL
  Filled 2019-07-16: qty 1

## 2019-07-16 MED ORDER — LEVOFLOXACIN 500 MG PO TABS: 500.0000 mg | ORAL_TABLET | Freq: Every day | ORAL | 0 refills | Status: AC

## 2019-07-16 MED ORDER — CEPHALEXIN 500 MG PO CAPS
500.0000 mg | ORAL_CAPSULE | Freq: Two times a day (BID) | ORAL | Status: DC
  Administered 2019-07-16: 17:00:00 500 mg via ORAL
  Filled 2019-07-16: qty 1

## 2019-07-16 MED ORDER — TETANUS-DIPHTH-ACELL PERTUSSIS 5-2.5-18.5 LF-MCG/0.5 IM SUSP
0.5000 mL | Freq: Once | INTRAMUSCULAR | Status: AC
  Administered 2019-07-16: 0.5 mL via INTRAMUSCULAR
  Filled 2019-07-16: qty 0.5

## 2019-07-16 MED ORDER — OXYCODONE-ACETAMINOPHEN 5-325 MG PO TABS: 1.0000 | ORAL_TABLET | ORAL | 0 refills | Status: DC | PRN

## 2019-07-16 MED ORDER — CEPHALEXIN 500 MG PO CAPS: 500.0000 mg | ORAL_CAPSULE | Freq: Two times a day (BID) | ORAL | 0 refills | Status: AC

## 2019-07-16 MED ORDER — LEVOFLOXACIN 500 MG PO TABS
500.0000 mg | ORAL_TABLET | Freq: Once | ORAL | Status: AC
  Administered 2019-07-16: 17:00:00 500 mg via ORAL
  Filled 2019-07-16: qty 1

## 2019-07-16 NOTE — ED Notes (Signed)
Patient transported to X-ray 

## 2019-07-16 NOTE — ED Triage Notes (Signed)
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   A/ox4

## 2019-07-16 NOTE — ED Notes (Addendum)
Per Juliane Lack, MD flush wound with 1 litter NS.    Left bottom of foot wound flushed with 1 litter of NS by this RN.   Patient given warm blanket.

## 2019-07-16 NOTE — ED Provider Notes (Signed)
[facility]-EMERGENCY DEPT Provider Note   CSN: [id number] Arrival date & time: 07/16/19  1600     History   Chief Complaint Chief Complaint  Patient presents with  . Foot Injury    HPI Casey Hampton is a 33 y.o. female past medical history of mitral valve disorder and asthma presented to the emergency department with impaling injury to left foot.  She reports she was at work and she stepped on a metal prong.  She presents emergency department wearing rubber foam slippers for which she is impaled at the base of her left foot.  She denies any fevers or chills.  She denies any additional injuries.  She reports she has had tetanus shots in the past but is unsure whether she has had one in the past 5 years.  She has drug allergies to sulfa drugs.  She reports no other drug allergies.     HPI  Past Medical History:  Diagnosis Date  . Asthma   . Mitral valve disorder   . Peripheral vascular disease [facility](HCC)     Patient Active Problem List   Diagnosis Date Noted  . Swelling of left lower extremity 08/03/2016  . Varicose veins of left lower extremity with inflammation 08/03/2016  . Chronic venous insufficiency 08/03/2016    Past Surgical History:  Procedure Laterality Date  . ADENOIDECTOMY    . TONSILLECTOMY    . VASCULAR SURGERY       OB History   No obstetric history on file.      Home Medications    Prior to Admission medications   Medication Sig Start Date End Date Taking? Authorizing Provider  albuterol (PROVENTIL HFA;VENTOLIN HFA) 108 (90 Base) MCG/ACT inhaler Inhale 1 puff into the lungs every 6 (six) hours as needed for wheezing or shortness of breath.    [provider]  amoxicillin-clavulanate (AUGMENTIN) 875-125 MG tablet Take 1 tablet by mouth every 12 (twelve) hours. 12/04/17   Tommie Samsook, Jayce G, DO  cephALEXin (KEFLEX) 500 MG capsule Take 1 capsule (500 mg total) by mouth 2 (two) times daily for 5 days. 07/16/19 07/21/19  Terald Sleeperrifan,  Massiah Minjares J, MD  cyclobenzaprine (FLEXERIL) 10 MG tablet Take 1 tablet (10 mg total) by mouth at bedtime. 05/23/18   Payton Mccallumonty, [place], MD  furosemide (LASIX) 20 MG tablet TAKE 1 TABLET ORALLY DAILY IN AM (STOP HCTZ) AND TAKE WITH POTASSIUM 04/16/18   [provider]  hydrochlorothiazide (HYDRODIURIL) 25 MG tablet Take 25 mg by mouth daily.    [provider]  levofloxacin (LEVAQUIN) 500 MG tablet Take 1 tablet (500 mg total) by mouth daily for 4 days. 07/17/19 07/21/19  Terald Sleeperrifan, Verle Brillhart J, MD  loratadine (CLARITIN) 10 MG tablet Take 10 mg by mouth daily.    [provider]  oxyCODONE-acetaminophen (PERCOCET/ROXICET) 5-325 MG tablet Take 1 tablet by mouth every 4 (four) hours as needed for up to 5 doses for severe pain. 07/16/19   Terald Sleeperrifan, Jametta Moorehead J, MD  phentermine 37.5 MG capsule Take 37.5 mg by mouth every morning.    [provider]  potassium chloride (K-DUR) 10 MEQ tablet TAKE 1 TABLET BY MOUTH IN THE MORNING WITH FUROSEMIDE 03/15/18   [provider]  traMADol (ULTRAM) 50 MG tablet Take 1 tablet (50 mg total) by mouth every 8 (eight) hours as needed. 12/04/17   Tommie Samsook, Jayce G, DO    Family History Family History  Problem Relation Age of Onset  . Varicose Veins Mother   .  Hyperlipidemia Mother   . Hypertension Mother     Social History Social History   Tobacco Use  . Smoking status: Current Every Day Smoker  . Smokeless tobacco: Never Used  Substance Use Topics  . Alcohol use: Yes    Comment: rarely  . Drug use: No     Allergies   Sulfa antibiotics   Review of Systems Review of Systems  Constitutional: Negative for chills and fever.  Musculoskeletal: Positive for arthralgias, gait problem and myalgias.  Skin: Positive for wound. Negative for rash.  Neurological: Negative for weakness and numbness.  Psychiatric/Behavioral: Negative for agitation and confusion.  All other systems reviewed and are negative.    Physical Exam Updated Vital  Signs BP (!) 155/129 (BP Location: Left Arm)   Pulse 100   Temp 97.8 F (36.6 C) (Oral)   Resp 18   LMP 07/03/2019   SpO2 98%   Physical Exam Vitals signs and nursing note reviewed.  Constitutional:      General: She is not in acute distress.    Appearance: She is well-developed.  HENT:     Head: Normocephalic and atraumatic.  Eyes:     Conjunctiva/sclera: Conjunctivae normal.  Neck:     Musculoskeletal: Neck supple.  Cardiovascular:     Rate and Rhythm: Normal rate and regular rhythm.     Pulses: Normal pulses.  Pulmonary:     Effort: Pulmonary effort is normal. No respiratory distress.  Abdominal:     Palpations: Abdomen is soft.     Tenderness: There is no abdominal tenderness.  Musculoskeletal:     Comments: Puncture wound on plantar surface of left foot near base of 2nd toe, wire removed, no active bleeding or palpable fragments in wound  Skin:    General: Skin is warm and dry.  Neurological:     General: No focal deficit present.     Mental Status: She is alert and oriented to person, place, and time.      ED Treatments / Results  Labs (all labs ordered are listed, but only abnormal results are displayed) Labs Reviewed - No data to display  EKG None  Radiology Dg Foot Complete Left  Result Date: 07/16/2019 CLINICAL DATA:  Impaling injury, assess for retained foreign body EXAM: LEFT FOOT - COMPLETE 3+ VIEW COMPARISON:  None. FINDINGS: No fracture or dislocation of the left foot. Joint spaces are well preserved. Soft tissues are unremarkable. No retained radiopaque foreign body. IMPRESSION: No fracture or dislocation of the left foot. No retained radiopaque foreign body. Electronically Signed   By: Eddie Candle M.D.   On: 07/16/2019 16:43    Procedures Procedures (including critical care time)  Medications Ordered in ED Medications  Tdap (BOOSTRIX) injection 0.5 mL (0.5 mLs Intramuscular Given 07/16/19 1650)  ibuprofen (ADVIL) tablet 800 mg (800 mg Oral  Given 07/16/19 1649)  levofloxacin (LEVAQUIN) tablet 500 mg (500 mg Oral Given 07/16/19 1649)     Initial Impression / Assessment and Plan / ED Course  I have reviewed the triage vital signs and the nursing notes.  Pertinent labs & imaging results that were available during my care of the patient were reviewed by me and considered in my medical decision making (see chart for details).  33 yo female presenting with puncture wound through the sole of her flip flop into the base of her left foot.  The wire was easily removed on her initial assessment and appears to have punctured to a depth of approxiametly 0.75  inches.  Plan to xray foot for retained fragments, wash out wound, update tetanus (just booster, as patient has had prior tetanus vaccines), start on double antibiotic ppx, keflex for Staph Coverage and ciprofloxacin for pseudomonal coverage   Discussed with her the need to carefully monitor her foot for signs of infection at home, and to f/u with her PCP for wound re-inspection at the end of her antibiotic course.  Strongly advised her to keep wound dry and clean, to avoid swimming or standing or wading through water.    Final Clinical Impressions(s) / ED Diagnoses   Final diagnoses:  Puncture wound    ED Discharge Orders         Ordered    levofloxacin (LEVAQUIN) 500 MG tablet  Daily     07/16/19 1711    cephALEXin (KEFLEX) 500 MG capsule  2 times daily     07/16/19 1711    oxyCODONE-acetaminophen (PERCOCET/ROXICET) 5-325 MG tablet  Every 4 hours PRN     07/16/19 1711           Terald Sleeper, MD 07/17/19 [phone]

## 2019-10-28 ENCOUNTER — Telehealth: Payer: Self-pay | Admitting: Certified Nurse Midwife

## 2019-10-28 NOTE — Telephone Encounter (Signed)
Pt called in and asked to talk to you, she wants you advice on something. Pt stated that she used to see doc d. Pt is requesting a call back

## 2019-10-29 NOTE — Telephone Encounter (Signed)
Pt states she did not call [facility]. Apologized for calling her.   Followed up with BH she was certain this was the correct pt. She verifies the phone number and dob with every call.

## 2020-06-13 IMAGING — CR DG FOOT COMPLETE 3+V*L*
3 series · 3 of 3 positions shown · non-contrast
Comparison: None.

CLINICAL DATA: Impaling injury, assess for retained foreign body

EXAM:
LEFT FOOT - COMPLETE 3+ VIEW

[x foot ap left]
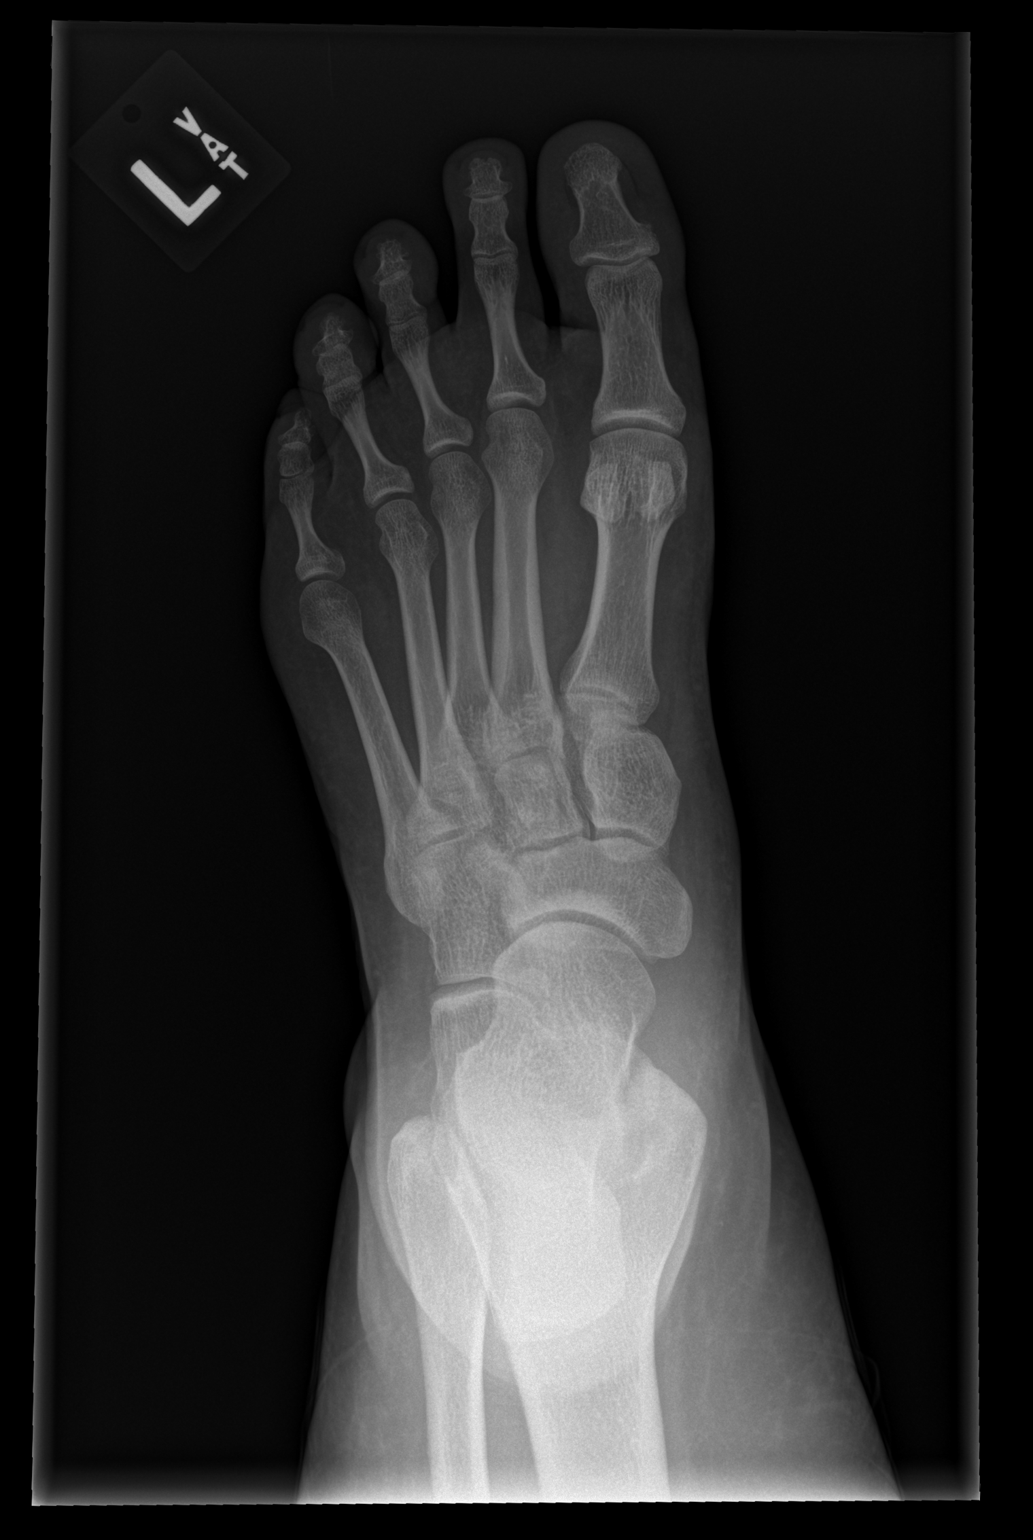

[x foot obl left]
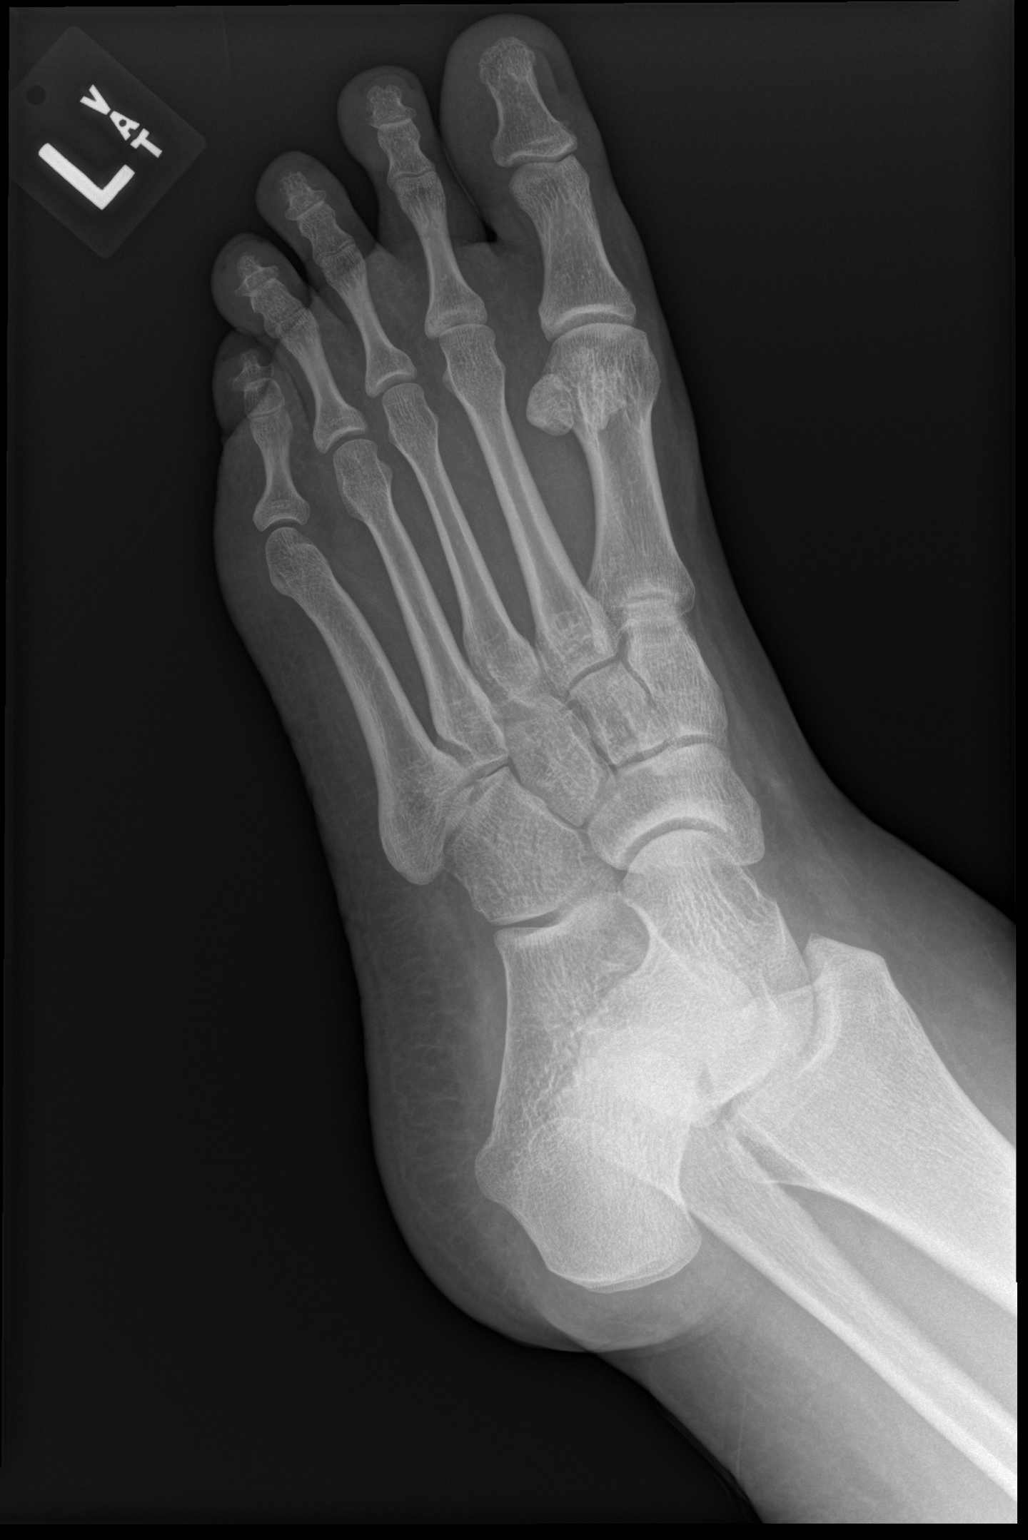

[x foot lat left]
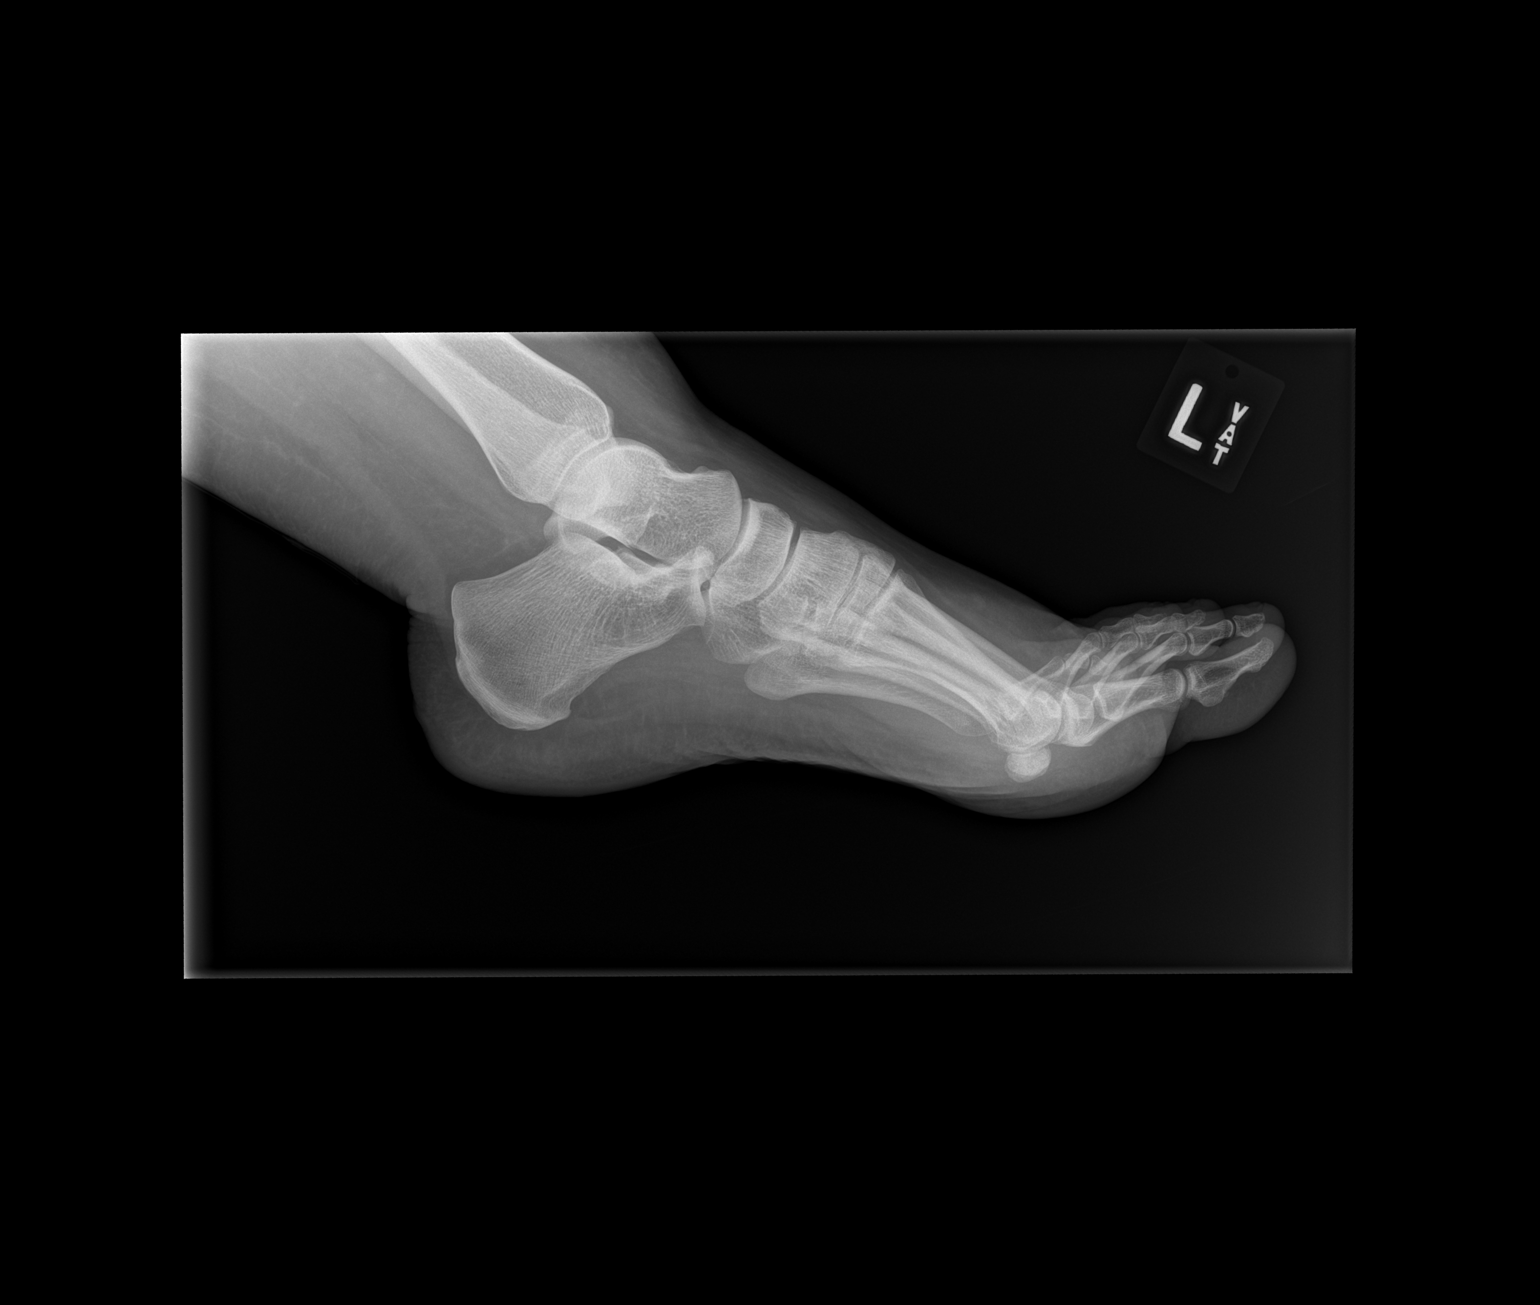

[3 of 3 positions shown; findings below may reference images not displayed]

FINDINGS: No fracture or dislocation of the left foot. Joint spaces are well
preserved. Soft tissues are unremarkable. No retained radiopaque
foreign body.
IMPRESSION: No fracture or dislocation of the left foot. No retained radiopaque
foreign body.

## 2021-01-14 ENCOUNTER — Ambulatory Visit
Admission: EM | Admit: 2021-01-14 | Discharge: 2021-01-14 | Disposition: A | Discharge status: Home / Self Care | Payer: Medicaid Other | Attending: Family Medicine | Admitting: Family Medicine

## 2021-01-14 ENCOUNTER — Other Ambulatory Visit: Payer: Self-pay

## 2021-01-14 DIAGNOSIS — N3000 Acute cystitis without hematuria: Secondary | ICD-10-CM | POA: Diagnosis not present

## 2021-01-14 LAB — URINALYSIS, COMPLETE (UACMP) WITH MICROSCOPIC: WBC, UA: >50 WBC/hpf (ref 0–5)

## 2021-01-14 MED ORDER — CEFDINIR 300 MG PO CAPS: 300.0000 mg | ORAL_CAPSULE | Freq: Two times a day (BID) | ORAL | 0 refills | Status: DC

## 2021-01-14 MED ORDER — KETOROLAC TROMETHAMINE 10 MG PO TABS: 10.0000 mg | ORAL_TABLET | Freq: Four times a day (QID) | ORAL | 0 refills | Status: DC | PRN

## 2021-01-14 MED ORDER — DICLOFENAC SODIUM 75 MG PO TBEC: 75.0000 mg | DELAYED_RELEASE_TABLET | Freq: Two times a day (BID) | ORAL | 0 refills | Status: AC | PRN

## 2021-01-14 NOTE — Discharge Instructions (Signed)
Lots of fluids.  Medication as prescribed.  Take care  Dr. Deanie Jupiter  

## 2021-01-14 NOTE — ED Triage Notes (Signed)
Pt reports having low back pain, frequent urination and dysuria x2 weeks. Pt have been using AZO without relief.

## 2021-01-14 NOTE — ED Provider Notes (Signed)
[facility]    CSN: [id number] Arrival date & time: 01/14/21  1939      History   Chief Complaint Chief Complaint  Patient presents with  . Back Pain   HPI  35 year old female presents with concerns for UTI.  Patient reports a 2-week history of symptoms.  She reports urinary frequency, low back pain, dysuria.  She has been taking over-the-counter Azo without relief.  She states that she has been trying to get into see someone but has been unable to.  She rates her pain is 8/10 in severity.  She states that she initially felt like she had fever but has not had any in several days.  No current chills.  No relieving factors.  No other complaints.  Past Medical History:  Diagnosis Date  . Asthma   . Mitral valve disorder   . Peripheral vascular disease [facility])     Patient Active Problem List   Diagnosis Date Noted  . Swelling of left lower extremity 08/03/2016  . Varicose veins of left lower extremity with inflammation 08/03/2016  . Chronic venous insufficiency 08/03/2016    Past Surgical History:  Procedure Laterality Date  . ADENOIDECTOMY    . TONSILLECTOMY    . VASCULAR SURGERY      OB History   No obstetric history on file.      Home Medications    Prior to Admission medications   Medication Sig Start Date End Date Taking? Authorizing Provider  cefdinir (OMNICEF) 300 MG capsule Take 1 capsule (300 mg total) by mouth 2 (two) times daily. 01/14/21  Yes Ines Warf G, DO  diclofenac (VOLTAREN) 75 MG EC tablet Take 1 tablet (75 mg total) by mouth 2 (two) times daily as needed for up to 7 days for mild pain or moderate pain. 01/14/21 01/21/21 Yes Irlene Crudup G, DO  ketorolac (TORADOL) 10 MG tablet Take 1 tablet (10 mg total) by mouth every 6 (six) hours as needed for moderate pain or severe pain. 01/14/21  Yes Ilianna Bown G, DO  albuterol (PROVENTIL HFA;VENTOLIN HFA) 108 (90 Base) MCG/ACT inhaler Inhale 1 puff into the lungs every 6 (six) hours as needed for  wheezing or shortness of breath.    [provider]  furosemide (LASIX) 20 MG tablet TAKE 1 TABLET ORALLY DAILY IN AM (STOP HCTZ) AND TAKE WITH POTASSIUM 04/16/18 01/14/21  [provider]  hydrochlorothiazide (HYDRODIURIL) 25 MG tablet Take 25 mg by mouth daily.  01/14/21  [provider]  loratadine (CLARITIN) 10 MG tablet Take 10 mg by mouth daily.  01/14/21  [provider]  phentermine 37.5 MG capsule Take 37.5 mg by mouth every morning.  01/14/21  [provider]  potassium chloride (K-DUR) 10 MEQ tablet TAKE 1 TABLET BY MOUTH IN THE MORNING WITH FUROSEMIDE 03/15/18 01/14/21  [provider]    Family History Family History  Problem Relation Age of Onset  . Varicose Veins Mother   . Hyperlipidemia Mother   . Hypertension Mother     Social History Social History   Tobacco Use  . Smoking status: Current Every Day Smoker  . Smokeless tobacco: Never Used  Vaping Use  . Vaping Use: Some days  Substance Use Topics  . Alcohol use: Yes    Comment: rarely  . Drug use: No     Allergies   Sulfa antibiotics   Review of Systems Review of Systems Per HPI  Physical Exam Triage Vital Signs ED Triage Vitals  Enc  Vitals Group     BP 01/14/21 1946 130/83     Pulse Rate 01/14/21 1946 87     Resp 01/14/21 1946 16     Temp 01/14/21 1946 98.2 F (36.8 C)     Temp Source 01/14/21 1946 Oral     SpO2 01/14/21 1946 99 %     Weight 01/14/21 1944 250 lb (113.4 kg)     Height 01/14/21 1944 5\' 8"  (1.727 m)     Head Circumference --      Peak Flow --      Pain Score 01/14/21 1944 8     Pain Loc --      Pain Edu? --      Excl. in GC? --     Updated Vital Signs BP 130/83   Pulse 87   Temp 98.2 F (36.8 C) (Oral)   Resp 16   Ht 5\' 8"  (1.727 m)   Wt 113.4 kg   LMP 12/24/2020   SpO2 99%   BMI 38.01 kg/m   Visual Acuity Right Eye Distance:   Left Eye Distance:   Bilateral Distance:    Right Eye Near:   Left Eye Near:     Bilateral Near:     Physical Exam Constitutional:      General: She is not in acute distress.    Appearance: Normal appearance. She is obese. She is not ill-appearing.  HENT:     Head: Normocephalic and atraumatic.  Eyes:     General:        Right eye: No discharge.        Left eye: No discharge.     Conjunctiva/sclera: Conjunctivae normal.  Cardiovascular:     Rate and Rhythm: Normal rate and regular rhythm.  Pulmonary:     Effort: Pulmonary effort is normal. No respiratory distress.  Abdominal:     Tenderness: There is no right CVA tenderness or left CVA tenderness.     Comments: Soft, nondistended.  Mild tenderness in the suprapubic region.  Neurological:     Mental Status: She is alert.  Psychiatric:        Mood and Affect: Mood normal.        Behavior: Behavior normal.    UC Treatments / Results  Labs (all labs ordered are listed, but only abnormal results are displayed) Labs Reviewed  URINALYSIS, COMPLETE (UACMP) WITH MICROSCOPIC - Abnormal; Notable for the following components:      Result Value   Color, Urine ORANGE (*)    APPearance HAZY (*)    Glucose, UA   (*)    Value: TEST NOT REPORTED DUE TO COLOR INTERFERENCE OF URINE PIGMENT   Hgb urine dipstick   (*)    Value: TEST NOT REPORTED DUE TO COLOR INTERFERENCE OF URINE PIGMENT   Bilirubin Urine   (*)    Value: TEST NOT REPORTED DUE TO COLOR INTERFERENCE OF URINE PIGMENT   Ketones, ur   (*)    Value: TEST NOT REPORTED DUE TO COLOR INTERFERENCE OF URINE PIGMENT   Protein, ur   (*)    Value: TEST NOT REPORTED DUE TO COLOR INTERFERENCE OF URINE PIGMENT   Nitrite   (*)    Value: TEST NOT REPORTED DUE TO COLOR INTERFERENCE OF URINE PIGMENT   Leukocytes,Ua   (*)    Value: TEST NOT REPORTED DUE TO COLOR INTERFERENCE OF URINE PIGMENT   Bacteria, UA FEW (*)    All other components within normal limits  EKG   Radiology No results found.  Procedures Procedures (including critical care time)  Medications  Ordered in UC Medications - No data to display  Initial Impression / Assessment and Plan / UC Course  I have reviewed the triage vital signs and the nursing notes.  Pertinent labs & imaging results that were available during my care of the patient were reviewed by me and considered in my medical decision making (see chart for details).    35 year old female presents with UTI.  Sending culture.  Placing on Keflex.  Diclofenac as needed for pain.  Final Clinical Impressions(s) / UC Diagnoses   Final diagnoses:  Acute cystitis without hematuria     Discharge Instructions     Lots of fluids.  Medication as prescribed.  Take care  Dr. Adriana Simas    ED Prescriptions    Medication Sig Dispense Auth. Provider   cefdinir (OMNICEF) 300 MG capsule Take 1 capsule (300 mg total) by mouth 2 (two) times daily. 14 capsule Verla Bryngelson G, DO   ketorolac (TORADOL) 10 MG tablet Take 1 tablet (10 mg total) by mouth every 6 (six) hours as needed for moderate pain or severe pain. 20 tablet Keyshun Elpers G, DO   diclofenac (VOLTAREN) 75 MG EC tablet Take 1 tablet (75 mg total) by mouth 2 (two) times daily as needed for up to 7 days for mild pain or moderate pain. 14 tablet [place], [place] G, DO     I have reviewed the PDMP during this encounter.   Tommie Sams, [place] 01/14/21 2119

## 2021-01-17 LAB — URINE CULTURE: Culture: >=100000 — AB

## 2021-01-18 ENCOUNTER — Telehealth (HOSPITAL_COMMUNITY): Payer: Self-pay | Admitting: Emergency Medicine

## 2021-01-18 MED ORDER — NITROFURANTOIN MONOHYD MACRO 100 MG PO CAPS: 100.0000 mg | ORAL_CAPSULE | Freq: Two times a day (BID) | ORAL | 0 refills | Status: DC

## 2022-07-08 ENCOUNTER — Encounter: Payer: Self-pay | Admitting: Emergency Medicine

## 2022-07-08 ENCOUNTER — Ambulatory Visit
Admission: EM | Admit: 2022-07-08 | Discharge: 2022-07-08 | Disposition: A | Discharge status: Home / Self Care | Payer: Medicaid Other | Attending: Emergency Medicine | Admitting: Emergency Medicine

## 2022-07-08 DIAGNOSIS — F17210 Nicotine dependence, cigarettes, uncomplicated: Secondary | ICD-10-CM | POA: Insufficient documentation

## 2022-07-08 DIAGNOSIS — O99519 Diseases of the respiratory system complicating pregnancy, unspecified trimester: Secondary | ICD-10-CM | POA: Insufficient documentation

## 2022-07-08 DIAGNOSIS — J069 Acute upper respiratory infection, unspecified: Secondary | ICD-10-CM

## 2022-07-08 DIAGNOSIS — O1200 Gestational edema, unspecified trimester: Secondary | ICD-10-CM | POA: Diagnosis not present

## 2022-07-08 DIAGNOSIS — Z20822 Contact with and (suspected) exposure to covid-19: Secondary | ICD-10-CM | POA: Diagnosis not present

## 2022-07-08 DIAGNOSIS — I739 Peripheral vascular disease, unspecified: Secondary | ICD-10-CM | POA: Diagnosis not present

## 2022-07-08 DIAGNOSIS — J4521 Mild intermittent asthma with (acute) exacerbation: Secondary | ICD-10-CM | POA: Diagnosis not present

## 2022-07-08 DIAGNOSIS — Z3A00 Weeks of gestation of pregnancy not specified: Secondary | ICD-10-CM | POA: Diagnosis not present

## 2022-07-08 DIAGNOSIS — R519 Headache, unspecified: Secondary | ICD-10-CM | POA: Diagnosis present

## 2022-07-08 DIAGNOSIS — O99330 Smoking (tobacco) complicating pregnancy, unspecified trimester: Secondary | ICD-10-CM | POA: Diagnosis not present

## 2022-07-08 LAB — RESP PANEL BY RT-PCR (FLU A&B, COVID) ARPGX2
Influenza A by PCR: NEGATIVE
Influenza B by PCR: NEGATIVE
SARS Coronavirus 2 by RT PCR: NEGATIVE

## 2022-07-08 MED ORDER — AEROCHAMBER MV MISC: 1 refills | Status: AC

## 2022-07-08 MED ORDER — FLUTICASONE PROPIONATE 50 MCG/ACT NA SUSP: 2.0000 | Freq: Every day | NASAL | 0 refills | Status: AC

## 2022-07-08 MED ORDER — PREDNISONE 20 MG PO TABS: 40.0000 mg | ORAL_TABLET | Freq: Every day | ORAL | 0 refills | Status: AC

## 2022-07-08 MED ORDER — ALBUTEROL SULFATE HFA 108 (90 BASE) MCG/ACT IN AERS: 1.0000 | INHALATION_SPRAY | RESPIRATORY_TRACT | 0 refills | Status: AC | PRN

## 2022-07-08 MED ORDER — NAPROXEN 500 MG PO TABS: 500.0000 mg | ORAL_TABLET | Freq: Two times a day (BID) | ORAL | 0 refills | Status: AC

## 2022-07-08 NOTE — Discharge Instructions (Addendum)
PCR testing were negative.  2 puffs from your albuterol inhaler using your spacer every 4 hours for 2 days, then every 6 hours for 2 days, then as needed.  Prednisone will help an asthma exacerbation in addition to sinus inflammation.  Flonase, saline nasal irrigation with a NeilMed sinus rinse and distilled water as often as you want, Mucinex D, Naprosyn combined with 1000 mg of Tylenol twice a day.  Go to the ER if your headache is not controlled with medications, worsening or change in your headache, neck stiffness, rash, or for any other concerns.

## 2022-07-08 NOTE — ED Triage Notes (Signed)
Patient c/o sinus congestion, runny nose, headache, that started last night. Patient denies fevers.

## 2022-07-08 NOTE — ED Provider Notes (Signed)
HPI  SUBJECTIVE:  Casey Hampton is a 36 y.o. female who presents with headaches, body aches, nasal congestion, rhinorrhea, sinus pain and pressure, postnasal drip, bilateral ear pain starting last night.  She reports cough, shortness of breath and dyspnea on exertion for the past 2 days.  She also reports some photophobia and phonophobia.  No neck stiffness, rash, fevers, upper dental pain, facial swelling, loss of sense of smell or taste, wheezing, chest pain, nausea, vomiting, diarrhea, abdominal pain.  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  She did not get the COVID or flu vaccines.  She states that her TMJ is "acting up".  No antibiotics in the past month.  No antipyretic in the past 6 hours.  No alleviating factors.  She has not tried anything for her symptoms.  Symptoms are worse with exposure to light and noise.  She has a past medical history of chronic migraines, TMJ arthralgia, asthma, has not been using her inhaler, but has a spacer.  She is status post tonsillectomy, and has chronic lower extremity edema/PVD.  She has never had COVID.  LMP: She is currently pregnant, however, plans to terminate the pregnancy on Monday.  She has already paid for and signed the consent for the procedure.  PCP: [facility].    Past Medical History:  Diagnosis Date   Asthma    Mitral valve disorder    Peripheral vascular disease (HCC)     Past Surgical History:  Procedure Laterality Date   ADENOIDECTOMY     TONSILLECTOMY     VASCULAR SURGERY      Family History  Problem Relation Age of Onset   Varicose Veins Mother    Hyperlipidemia Mother    Hypertension Mother     Social History   Tobacco Use   Smoking status: Every Day    Types: Cigarettes   Smokeless tobacco: Never  Vaping Use   Vaping Use: Some days  Substance Use Topics   Alcohol use: Yes    Comment: rarely   Drug use: No    No current facility-administered medications for this  encounter.  Current Outpatient Medications:    albuterol (VENTOLIN HFA) 108 (90 Base) MCG/ACT inhaler, Inhale 1-2 puffs into the lungs every 4 (four) hours as needed for wheezing or shortness of breath., Disp: 1 each, Rfl: 0   fluticasone (FLONASE) 50 MCG/ACT nasal spray, Place 2 sprays into both nostrils daily., Disp: 16 g, Rfl: 0   naproxen (NAPROSYN) 500 MG tablet, Take 1 tablet (500 mg total) by mouth 2 (two) times daily., Disp: 20 tablet, Rfl: 0   predniSONE (DELTASONE) 20 MG tablet, Take 2 tablets (40 mg total) by mouth daily with breakfast for 5 days., Disp: 10 tablet, Rfl: 0   Spacer/Aero-Holding Chambers (AEROCHAMBER MV) inhaler, Use as instructed, Disp: 1 each, Rfl: 1  Allergies  Allergen Reactions   Sulfa Antibiotics Anaphylaxis     ROS  As noted in HPI.   Physical Exam  BP 125/69 (BP Location: Left Arm)   Pulse 85   Temp 98.2 F (36.8 C) (Oral)   Resp 14   Ht 5\' 8"  (1.727 m)   Wt 115.7 kg   SpO2 98%   BMI 38.77 kg/m   Constitutional: Well developed, well nourished, no acute distress Eyes:  EOMI, conjunctiva normal bilaterally HENT: Normocephalic, atraumatic,mucus membranes moist.  TMs normal bilaterally.  Bilateral TMJ tenderness, worse on the right.  Positive jaw clicking.  Erythematous,  swollen turbinates, clear nasal congestion.  Positive maxillary, frontal sinus tenderness.  Tonsils surgically absent.  Uvula midline.  Positive cobblestoning and postnasal drip. Neck: Positive cervical lymphadenopathy Respiratory: Normal inspiratory effort, lungs clear bilaterally.  Prolonged expiratory phase.  No anterior, lateral chest wall tenderness Cardiovascular: Normal rate, regular rhythm, no murmurs rubs or gallops GI: nondistended skin: No rash, skin intact Musculoskeletal: no deformities Neurologic: Alert & oriented x 3, no focal neuro deficits Psychiatric: Speech and behavior appropriate   ED Course   Medications - No data to display  Orders Placed This  Encounter  Procedures   Resp Panel by RT-PCR (Flu A&B, Covid) Anterior Nasal Swab    Standing Status:   Standing    Number of Occurrences:   1   Airborne and Contact precautions    Standing Status:   Standing    Number of Occurrences:   1    Results for orders placed or performed during the hospital encounter of 07/08/22 (from the past 24 hour(s))  Resp Panel by RT-PCR (Flu A&B, Covid) Anterior Nasal Swab     Status: None   Collection Time: 07/08/22 11:48 AM   Specimen: Anterior Nasal Swab  Result Value Ref Range   SARS Coronavirus 2 by RT PCR NEGATIVE NEGATIVE   Influenza A by PCR NEGATIVE NEGATIVE   Influenza B by PCR NEGATIVE NEGATIVE   No results found.  ED Clinical Impression  1. Upper respiratory tract infection, unspecified type   2. Intermittent asthma with acute exacerbation, unspecified asthma severity      ED Assessment/Plan     COVID, influenza negative. There is no sign of meningitis at this time.  She does not meet criteria for antibiotics for sinusitis per ISDA guidelines.  Patient with an upper respiratory infection, and possibly early asthma exacerbation.  Home with regularly scheduled albuterol with a spacer for 4 days, then as needed, prednisone 40 mg for 5 days, Flonase, saline nasal irrigation, Mucinex D, Naprosyn/Tylenol.  Work note for today and tomorrow.  Follow-up with PCP as needed  Discussed labs, MDM, treatment plan, and plan for follow-up with patient.  Written ER return precautions given.  patient agrees with plan.   Meds ordered this encounter  Medications   albuterol (VENTOLIN HFA) 108 (90 Base) MCG/ACT inhaler    Sig: Inhale 1-2 puffs into the lungs every 4 (four) hours as needed for wheezing or shortness of breath.    Dispense:  1 each    Refill:  0   fluticasone (FLONASE) 50 MCG/ACT nasal spray    Sig: Place 2 sprays into both nostrils daily.    Dispense:  16 g    Refill:  0   Spacer/Aero-Holding Chambers (AEROCHAMBER MV) inhaler     Sig: Use as instructed    Dispense:  1 each    Refill:  1   predniSONE (DELTASONE) 20 MG tablet    Sig: Take 2 tablets (40 mg total) by mouth daily with breakfast for 5 days.    Dispense:  10 tablet    Refill:  0   naproxen (NAPROSYN) 500 MG tablet    Sig: Take 1 tablet (500 mg total) by mouth 2 (two) times daily.    Dispense:  20 tablet    Refill:  0      *This clinic note was created using [employer]. Therefore, there may be occasional mistakes despite careful proofreading.  ?    Melynda Ripple, MD 07/08/22 1339

## 2022-12-26 ENCOUNTER — Emergency Department (HOSPITAL_COMMUNITY)
Admission: EM | Admit: 2022-12-26 | Discharge: 2022-12-27 | Discharge status: Against Medical Advice | Payer: Medicaid Other | Attending: Emergency Medicine | Admitting: Emergency Medicine

## 2022-12-26 ENCOUNTER — Other Ambulatory Visit: Payer: Self-pay

## 2022-12-26 DIAGNOSIS — S40021A Contusion of right upper arm, initial encounter: Secondary | ICD-10-CM | POA: Insufficient documentation

## 2022-12-26 DIAGNOSIS — X58XXXA Exposure to other specified factors, initial encounter: Secondary | ICD-10-CM | POA: Diagnosis not present

## 2022-12-26 DIAGNOSIS — Z5321 Procedure and treatment not carried out due to patient leaving prior to being seen by health care provider: Secondary | ICD-10-CM | POA: Insufficient documentation

## 2022-12-26 DIAGNOSIS — M79601 Pain in right arm: Secondary | ICD-10-CM | POA: Diagnosis present

## 2022-12-26 DIAGNOSIS — F17200 Nicotine dependence, unspecified, uncomplicated: Secondary | ICD-10-CM | POA: Insufficient documentation

## 2022-12-26 NOTE — ED Triage Notes (Signed)
Pt c/o right arm pain, states the pain started early Sunday morning in her armpit and the pain is now radiating all the way down to her right hand. Pt also reports she has bruising to her right arm but denies any injury. Pt is concerned she may have a blood clot as she is on [facility] and is a regular smoker.
